# Patient Record
Sex: Male | Born: 1970 | Hispanic: Yes | Smoking: Never smoker
Health system: Southern US, Community
[De-identification: ages and names within clinical notes are randomized; demographics above are authoritative.]

## PROBLEM LIST (undated history)

## (undated) DIAGNOSIS — I4892 Unspecified atrial flutter: Secondary | ICD-10-CM

---

## 2014-07-11 ENCOUNTER — Encounter (HOSPITAL_COMMUNITY): Payer: Self-pay | Admitting: Emergency Medicine

## 2014-07-11 DIAGNOSIS — IMO0002 Reserved for concepts with insufficient information to code with codable children: Secondary | ICD-10-CM | POA: Diagnosis not present

## 2014-07-11 DIAGNOSIS — F1010 Alcohol abuse, uncomplicated: Secondary | ICD-10-CM | POA: Insufficient documentation

## 2014-07-11 DIAGNOSIS — K861 Other chronic pancreatitis: Secondary | ICD-10-CM | POA: Insufficient documentation

## 2014-07-11 DIAGNOSIS — X503XXA Overexertion from repetitive movements, initial encounter: Secondary | ICD-10-CM | POA: Insufficient documentation

## 2014-07-11 DIAGNOSIS — R1012 Left upper quadrant pain: Secondary | ICD-10-CM | POA: Insufficient documentation

## 2014-07-11 DIAGNOSIS — X500XXA Overexertion from strenuous movement or load, initial encounter: Secondary | ICD-10-CM | POA: Diagnosis not present

## 2014-07-11 DIAGNOSIS — Y9289 Other specified places as the place of occurrence of the external cause: Secondary | ICD-10-CM | POA: Diagnosis not present

## 2014-07-11 DIAGNOSIS — Y9389 Activity, other specified: Secondary | ICD-10-CM | POA: Insufficient documentation

## 2014-07-11 NOTE — ED Notes (Addendum)
Presents with 3 months of left sided abdominal pain described as burning inside pain is also in left flank and back, endorses pain with urination and pain in right flank as well. Denies fevers. Denies hematuria. Denies nausea and vomiting.

## 2014-07-11 NOTE — ED Notes (Signed)
Pt could not void at this time.  Pt has a specimen cup with instructions,

## 2014-07-12 ENCOUNTER — Emergency Department (HOSPITAL_COMMUNITY)
Admission: EM | Admit: 2014-07-12 | Discharge: 2014-07-12 | Disposition: A | Discharge status: Home / Self Care | Payer: 59 | Attending: Emergency Medicine | Admitting: Emergency Medicine

## 2014-07-12 DIAGNOSIS — K860 Alcohol-induced chronic pancreatitis: Secondary | ICD-10-CM

## 2014-07-12 DIAGNOSIS — T148XXA Other injury of unspecified body region, initial encounter: Secondary | ICD-10-CM

## 2014-07-12 LAB — CBC WITH DIFFERENTIAL/PLATELET
Basophils Absolute: 0 10*3/uL (ref 0.0–0.1)
Basophils Relative: 0 % (ref 0–1)
EOS PCT: 3 % (ref 0–5)
Eosinophils Absolute: 0.2 10*3/uL (ref 0.0–0.7)
HCT: 41.9 % (ref 39.0–52.0)
HEMOGLOBIN: 14.6 g/dL (ref 13.0–17.0)
LYMPHS ABS: 3 10*3/uL (ref 0.7–4.0)
LYMPHS PCT: 40 % (ref 12–46)
MCH: 30.8 pg (ref 26.0–34.0)
MCHC: 34.8 g/dL (ref 30.0–36.0)
MCV: 88.4 fL (ref 78.0–100.0)
MONO ABS: 0.6 10*3/uL (ref 0.1–1.0)
Monocytes Relative: 9 % (ref 3–12)
NEUTROS PCT: 48 % (ref 43–77)
Neutro Abs: 3.6 10*3/uL (ref 1.7–7.7)
PLATELETS: 197 10*3/uL (ref 150–400)
RBC: 4.74 MIL/uL (ref 4.22–5.81)
RDW: 12.7 % (ref 11.5–15.5)
WBC: 7.4 10*3/uL (ref 4.0–10.5)

## 2014-07-12 LAB — URINE MICROSCOPIC-ADD ON

## 2014-07-12 LAB — COMPREHENSIVE METABOLIC PANEL
ALT: 45 U/L (ref 0–53)
ANION GAP: 13 (ref 5–15)
AST: 24 U/L (ref 0–37)
Albumin: 3.6 g/dL (ref 3.5–5.2)
Alkaline Phosphatase: 78 U/L (ref 39–117)
BILIRUBIN TOTAL: 0.2 mg/dL — AB (ref 0.3–1.2)
BUN: 12 mg/dL (ref 6–23)
CHLORIDE: 103 meq/L (ref 96–112)
CO2: 22 meq/L (ref 19–32)
Calcium: 9.3 mg/dL (ref 8.4–10.5)
Creatinine, Ser: 0.87 mg/dL (ref 0.50–1.35)
GFR calc Af Amer: >90 mL/min (ref >90)
Glucose, Bld: 89 mg/dL (ref 70–99)
POTASSIUM: 3.6 meq/L — AB (ref 3.7–5.3)
SODIUM: 138 meq/L (ref 137–147)
Total Protein: 7.7 g/dL (ref 6.0–8.3)

## 2014-07-12 LAB — URINALYSIS, ROUTINE W REFLEX MICROSCOPIC
BILIRUBIN URINE: NEGATIVE
Glucose, UA: NEGATIVE mg/dL
HGB URINE DIPSTICK: NEGATIVE
Ketones, ur: NEGATIVE mg/dL
NITRITE: NEGATIVE
PH: 6.5 (ref 5.0–8.0)
Protein, ur: NEGATIVE mg/dL
SPECIFIC GRAVITY, URINE: 1.019 (ref 1.005–1.030)
Urobilinogen, UA: 1 mg/dL (ref 0.0–1.0)

## 2014-07-12 LAB — I-STAT TROPONIN, ED: Troponin i, poc: 0 ng/mL (ref 0.00–0.08)

## 2014-07-12 LAB — LIPASE, BLOOD: LIPASE: 48 U/L (ref 11–59)

## 2014-07-12 MED ORDER — IBUPROFEN 400 MG PO TABS
600.0000 mg | ORAL_TABLET | Freq: Once | ORAL | Status: AC
  Administered 2014-07-12: 600 mg via ORAL
  Filled 2014-07-12 (×2): qty 1

## 2014-07-12 NOTE — Discharge Instructions (Signed)
You can safely take Ibuprofen for your muscle aches It is very important that you do not drink alcohol

## 2014-07-12 NOTE — ED Provider Notes (Signed)
Medical screening examination/treatment/procedure(s) were performed by non-physician practitioner and as supervising physician I was immediately available for consultation/collaboration.    Daneisha Surges, MD 07/12/14 0420 

## 2014-07-12 NOTE — ED Provider Notes (Signed)
CSN: [id number]     Arrival date & time 07/11/14  2306 History   First MD Initiated Contact with Patient 07/12/14 0149     Chief Complaint  Patient presents with  . Abdominal Pain     (Consider location/radiation/quality/duration/timing/severity/associated sxs/prior Treatment) HPI Comments: Patient presents with 3-5 months of intermittent, left upper quadrant pain, and right flank pain.  He works as a Designer, fashion/clothing.  He, states, when he is working at the pain will increase he also drinks 6-12 coronal is a weekend and notices on Monday or Tuesday, that he has an increase in his left upper quadrant pain.  Denies any vomiting, diarrhea, fever, shortness of breath, chest pain. He has not seen anyone for this discomfort.  Tonight.  He became diaphoretic with the pain  Patient is a 43 y.o. male presenting with abdominal pain. The history is provided by the patient.  Abdominal Pain Pain location:  LUQ Pain quality: aching   Pain radiates to:  Back Onset quality:  Gradual Timing:  Intermittent Progression:  Worsening Chronicity:  Recurrent Context: alcohol use   Relieved by:  None tried Worsened by:  Movement Ineffective treatments:  None tried Associated symptoms: no chest pain, no cough, no fever, no hematuria, no nausea and no vomiting     History reviewed. No pertinent past medical history. History reviewed. No pertinent past surgical history. History reviewed. No pertinent family history. History  Substance Use Topics  . Smoking status: Never Smoker   . Smokeless tobacco: Not on file  . Alcohol Use: Yes    Review of Systems  Constitutional: Negative for fever.  Respiratory: Negative for cough and chest tightness.   Cardiovascular: Negative for chest pain and leg swelling.  Gastrointestinal: Positive for abdominal pain. Negative for nausea and vomiting.  Genitourinary: Negative for hematuria.  Musculoskeletal: Positive for back pain.  Skin: Negative for rash and wound.   Neurological: Negative for dizziness.  All other systems reviewed and are negative.     Allergies  Review of patient's allergies indicates no known allergies.  Home Medications   Prior to Admission medications   Not on File   BP 146/101  Pulse 59  Temp(Src) 98.6 F (37 C) (Oral)  Resp 16  SpO2 98% Physical Exam  Nursing note and vitals reviewed. Constitutional: He appears well-developed and well-nourished. No distress.  HENT:  Head: Atraumatic.  Mouth/Throat: Oropharynx is clear and moist.  Eyes: Pupils are equal, round, and reactive to light.  Neck: Normal range of motion.  Cardiovascular: Normal rate and regular rhythm.   Pulmonary/Chest: Effort normal. No respiratory distress. He has no wheezes. He exhibits no tenderness.  Abdominal: He exhibits no distension. There is tenderness in the left upper quadrant.  Musculoskeletal: Normal range of motion.  Skin: Skin is warm and dry. No rash noted.    ED Course  Procedures (including critical care time) Labs Review Labs Reviewed  COMPREHENSIVE METABOLIC PANEL - Abnormal; Notable for the following:    Potassium 3.6 (*)    Total Bilirubin 0.2 (*)    All other components within normal limits  URINALYSIS, ROUTINE W REFLEX MICROSCOPIC - Abnormal; Notable for the following:    Leukocytes, UA TRACE (*)    All other components within normal limits  CBC WITH DIFFERENTIAL  LIPASE, BLOOD  URINE MICROSCOPIC-ADD ON  I-STAT TROPOININ, ED    Imaging Review No results found.   EKG Interpretation   Date/Time:  Friday July 12 2014 02:18:09 EDT Ventricular Rate:  59 PR Interval:  66 QRS Duration: 100 QT Interval:  427 QTC Calculation: 423 R Axis:   86 Text Interpretation:  Sinus rhythm Short PR interval Probable inferior  infarct, old No old tracing to compare Confirmed by [facility]  MD, DAVID  ([id number]) on 07/12/2014 2:30:21 AM      MDM   Final diagnoses:  Alcohol-induced chronic pancreatitis  Muscle strain      Is reviewed all within normal limits per, potassium of 3.6, and a slightly elevated lipase of 48.  Urine is clear, with no signs of hematuria.  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

## 2016-08-17 ENCOUNTER — Emergency Department: Payer: Self-pay

## 2016-08-17 ENCOUNTER — Emergency Department
Admission: EM | Admit: 2016-08-17 | Discharge: 2016-08-17 | Disposition: A | Discharge status: Home / Self Care | Payer: Self-pay | Attending: Emergency Medicine | Admitting: Emergency Medicine

## 2016-08-17 ENCOUNTER — Encounter: Payer: Self-pay | Admitting: Occupational Medicine

## 2016-08-17 DIAGNOSIS — M25552 Pain in left hip: Secondary | ICD-10-CM

## 2016-08-17 DIAGNOSIS — W11XXXA Fall on and from ladder, initial encounter: Secondary | ICD-10-CM | POA: Insufficient documentation

## 2016-08-17 DIAGNOSIS — M79642 Pain in left hand: Secondary | ICD-10-CM | POA: Insufficient documentation

## 2016-08-17 DIAGNOSIS — M545 Low back pain, unspecified: Secondary | ICD-10-CM

## 2016-08-17 DIAGNOSIS — Y9389 Activity, other specified: Secondary | ICD-10-CM | POA: Insufficient documentation

## 2016-08-17 DIAGNOSIS — Y990 Civilian activity done for income or pay: Secondary | ICD-10-CM | POA: Insufficient documentation

## 2016-08-17 DIAGNOSIS — W19XXXA Unspecified fall, initial encounter: Secondary | ICD-10-CM

## 2016-08-17 DIAGNOSIS — Y929 Unspecified place or not applicable: Secondary | ICD-10-CM | POA: Insufficient documentation

## 2016-08-17 DIAGNOSIS — M25521 Pain in right elbow: Secondary | ICD-10-CM

## 2016-08-17 DIAGNOSIS — R55 Syncope and collapse: Secondary | ICD-10-CM

## 2016-08-17 DIAGNOSIS — G9389 Other specified disorders of brain: Secondary | ICD-10-CM | POA: Insufficient documentation

## 2016-08-17 DIAGNOSIS — M79643 Pain in unspecified hand: Secondary | ICD-10-CM

## 2016-08-17 LAB — CBC
HCT: 46.8 % (ref 40.0–52.0)
HEMOGLOBIN: 15.8 g/dL (ref 13.0–18.0)
MCH: 29.2 pg (ref 26.0–34.0)
MCHC: 33.8 g/dL (ref 32.0–36.0)
MCV: 86.4 fL (ref 80.0–100.0)
PLATELETS: 189 10*3/uL (ref 150–440)
RBC: 5.41 MIL/uL (ref 4.40–5.90)
RDW: 13.1 % (ref 11.5–14.5)
WBC: 11 10*3/uL — ABNORMAL HIGH (ref 3.8–10.6)

## 2016-08-17 LAB — TROPONIN I: Troponin I: <0.03 ng/mL (ref <0.03)

## 2016-08-17 LAB — COMPREHENSIVE METABOLIC PANEL
ALK PHOS: 69 U/L (ref 38–126)
ALT: 43 U/L (ref 17–63)
ANION GAP: 6 (ref 5–15)
AST: 32 U/L (ref 15–41)
Albumin: 4.7 g/dL (ref 3.5–5.0)
BUN: 18 mg/dL (ref 6–20)
CO2: 26 mmol/L (ref 22–32)
Calcium: 9.3 mg/dL (ref 8.9–10.3)
Chloride: 107 mmol/L (ref 101–111)
Creatinine, Ser: 0.99 mg/dL (ref 0.61–1.24)
Glucose, Bld: 102 mg/dL — ABNORMAL HIGH (ref 65–99)
Potassium: 3.6 mmol/L (ref 3.5–5.1)
SODIUM: 139 mmol/L (ref 135–145)
Total Bilirubin: 0.8 mg/dL (ref 0.3–1.2)
Total Protein: 8.7 g/dL — ABNORMAL HIGH (ref 6.5–8.1)

## 2016-08-17 LAB — GLUCOSE, CAPILLARY: Glucose-Capillary: 127 mg/dL — ABNORMAL HIGH (ref 65–99)

## 2016-08-17 MED ORDER — MORPHINE SULFATE (PF) 4 MG/ML IV SOLN
4.0000 mg | Freq: Once | INTRAVENOUS | Status: AC
  Administered 2016-08-17: 4 mg via INTRAVENOUS
  Filled 2016-08-17: qty 1

## 2016-08-17 MED ORDER — OXYCODONE-ACETAMINOPHEN 5-325 MG PO TABS: 1.0000 | ORAL_TABLET | ORAL | 0 refills | Status: AC | PRN

## 2016-08-17 MED ORDER — OXYCODONE-ACETAMINOPHEN 5-325 MG PO TABS
2.0000 | ORAL_TABLET | Freq: Once | ORAL | Status: AC
  Administered 2016-08-17: 2 via ORAL
  Filled 2016-08-17: qty 2

## 2016-08-17 MED ORDER — HYDROMORPHONE HCL 1 MG/ML IJ SOLN
0.5000 mg | Freq: Once | INTRAMUSCULAR | Status: AC
Start: 2016-08-17 — End: 2016-08-17
  Administered 2016-08-17: 0.5 mg via INTRAVENOUS
  Filled 2016-08-17: qty 1

## 2016-08-17 MED ORDER — ONDANSETRON HCL 4 MG/2ML IJ SOLN
4.0000 mg | Freq: Once | INTRAMUSCULAR | Status: AC
  Administered 2016-08-17: 4 mg via INTRAVENOUS
  Filled 2016-08-17: qty 2

## 2016-08-17 MED ORDER — IBUPROFEN 800 MG PO TABS: 800.0000 mg | ORAL_TABLET | Freq: Three times a day (TID) | ORAL | 0 refills | Status: DC | PRN

## 2016-08-17 NOTE — ED Notes (Signed)
Pt states pain 7/10. Doesn't want anymore pain meds til after results.

## 2016-08-17 NOTE — ED Provider Notes (Signed)
This patient was signed out to me by Dr. Darnelle CatalanMalinda.  45 y.o. male after fall. The patient's workup in the emergency department has not showed any acute fractures or injuries from his fall. He did have a vasovagal episode after receiving Dilaudid, not having eaten anything since lunch, and having pain from his back contusion. At this time, his symptoms have completely resolved and he is feeling much better. He does have some mild ongoing back pain. I'll plan to discharge the patient home with expectant management for his injuries. [facility] He and his family understand return precautions as well as follow-up instructions.   Rockne MenghiniAnne-Caroline Nichole Keltner, MD 08/17/16 2311

## 2016-08-17 NOTE — ED Notes (Signed)
Pt feeling better pain 4/10 to left hip and left lower back. Pt drinking water. Requesting something to eat. O2 taking off. Will continue to monitor.

## 2016-08-17 NOTE — ED Notes (Signed)
Patient transported to CT and xray 

## 2016-08-17 NOTE — Discharge Instructions (Signed)
You may apply ice or heat for 15 minutes every 2 hours while you were awake for pain.  You may take Tylenol and Motrin for mild to moderate pain.  Percocet is for severe pain.  Do not drive within 8 hours of taking Percocet.  Return to the emergency department if you develop severe pain, numbness tingling or weakness, changes in bladder or bowel function, severe headache or vomiting, or any other symptoms concerning to you.

## 2016-08-17 NOTE — ED Provider Notes (Signed)
[facility] Emergency Department Provider Note     First MD Initiated Contact with Patient 08/17/16 1843     (approximate)  I have reviewed the triage vital signs and the nursing notes.   HISTORY  Chief Complaint Fall (from roof denies LOC and hitting head ); Hip Pain (left); Arm Pain (right); and Hand Pain (left)    HPI Kenneth Marsh is a 45 y.o. male patient was at work reports he fell off ladder about 12-15 feet. He complains of pain in the left hip left thenar eminence and the right elbow. He denies any loss of consciousness. Says he didn't think he hit his head. Hip is the worst pain. He denies any numbness or tingling but the elbow pain does radiate down into the forearm. Pain is moderately severe and achy in nature.Marland Kitchen   History reviewed. No pertinent past medical history.  There are no active problems to display for this patient.   History reviewed. No pertinent surgical history.  Prior to Admission medications   Not on File    Allergies Review of patient's allergies indicates no known allergies.  History reviewed. No pertinent family history.  Social History Social History  Substance Use Topics  . Smoking status: Never Smoker  . Smokeless tobacco: Never Used  . Alcohol use Yes    Review of Systems Constitutional: No fever/chills Eyes: No visual changes. ENT: No sore throat. Cardiovascular: Denies chest pain. Respiratory: Denies shortness of breath. Gastrointestinal: No abdominal pain.  No nausea, no vomiting.  No diarrhea.  No constipation. Genitourinary: Negative for dysuria. Musculoskeletal: Negative for back pain. Skin: Negative for rash. Neurological: Negative for headaches, focal weakness or numbness.  10-point ROS otherwise negative.  ____________________________________________   PHYSICAL EXAM:  VITAL SIGNS: ED Triage Vitals [08/17/16 1828]  Enc Vitals Group     BP (!) 153/98     Pulse Rate 86     Resp  18     Temp 98 F (36.7 C)     Temp Source Oral     SpO2 97 %     Weight 219 lb 8 oz (99.6 kg)     Height 5\' 8"  (1.727 m)     Head Circumference      Peak Flow      Pain Score 9     Pain Loc      Pain Edu?      Excl. in GC?     Constitutional: Alert and oriented. Well appearing Eyes: Conjunctivae are normal. PERRL. EOMI. Head: Atraumatic. Nose: No congestion/rhinnorhea. Mouth/Throat: Mucous membranes are moist.  Oropharynx non-erythematous. Neck: No stridor.No cervical spine tenderness to palpation Cardiovascular: Normal rate, regular rhythm. Grossly normal heart sounds.  Good peripheral circulation. Respiratory: Normal respiratory effort.  No retractions. Lungs CTAB. Gastrointestinal: Soft and nontender. No distention. No abdominal bruits. No CVA tenderness.Low back no tenderness Musculoskeletal: Tenderness in the hip as described in history of present illness tenderness in the elbow and the hand as described in history of present illness. Is no tenderness in the hand in any of the phalanges. Only new thenar eminence even to palpation full range of motion all joints in the hand Neurologic:  Normal speech and language. No gross focal neurologic deficits are appreciated. No gait instability. Skin:  Skin is warm, dry and intact. No rash noted. Psychiatric: Mood and affect are normal. Speech and behavior are normal.  ____________________________________________   LABS (all labs ordered are listed, but only abnormal results are displayed)  Labs Reviewed  COMPREHENSIVE METABOLIC PANEL - Abnormal; Notable for the following:       Result Value   Glucose, Bld 102 (*)    Total Protein 8.7 (*)    All other components within normal limits  CBC - Abnormal; Notable for the following:    WBC 11.0 (*)    All other components within normal limits  TROPONIN I  CBG MONITORING, ED    ____________________________________________  EKG   ____________________________________________  RADIOLOGY Study Result   CLINICAL DATA:  45 year old male with fall and left hand pain.  EXAM: LEFT HAND - COMPLETE 3+ VIEW  COMPARISON:  None.  FINDINGS: There is a tiny triangular corner bone fragment the radial base of the proximal phalanx of the third digit concerning for acute fracture. Correlation with clinical exam and point tenderness recommended. No other fracture identified. No dislocation. There is no arthritic changes. The soft tissues appear unremarkable.  IMPRESSION: Small bony fragment adjacent to the radial base of the proximal phalanx of the third digit concerning for an acute fracture. Clinical correlation is recommended.   Electronically Signed   By: Elgie Collard M.D.   On: 08/17/2016 19:16  There is no tenderness in this area of the patient's hand. The only tenderness is in the thenar eminence. There is no snuffbox tenderness either.    Study Result   CLINICAL DATA:  Pain following fall from roof  EXAM: CT HEAD WITHOUT CONTRAST  CT CERVICAL SPINE WITHOUT CONTRAST  TECHNIQUE: Multidetector CT imaging of the head and cervical spine was performed following the standard protocol without intravenous contrast. Multiplanar CT image reconstructions of the cervical spine were also generated.  COMPARISON:  None.  FINDINGS: CT HEAD FINDINGS  Brain: The ventricles are normal in size and configuration. There is no intracranial mass, hemorrhage, extra-axial fluid collection, or midline shift. Gray-white compartments are normal. No acute infarct evident.  Vascular: Hyperdense vessel.  No vascular calcifications.  Skull: Bony calvarium appears intact. There is hematoma in the muscles over the right temporal bone.  Sinuses/Orbits: There is opacification of several ethmoid air cells bilaterally. Other visualized paranasal sinuses  are clear. Orbits appear symmetric bilaterally.  Other: Mastoid air cells are clear.  CT CERVICAL SPINE FINDINGS  Alignment: There is no spondylolisthesis.  Skull base and vertebrae: Craniocervical junction region and skull base regions are normal. No fracture evident. No blastic or lytic bone lesions.  Soft tissues and spinal canal: Prevertebral soft tissues and predental space regions are normal. No paraspinous lesions. No spinal stenosis.  Disc levels: There is slight disc space narrowing at C5-6 and C6-7. There is mild facet hypertrophy bilaterally. No nerve root edema or effacement. No disc extrusion evident.  Upper chest: Visualized lung apices are clear.  Other: None  IMPRESSION: CT head: There is hematoma in the muscles overlying the right temporal bone. No fracture evident. No intracranial mass, hemorrhage, or extra-axial fluid collection. Gray-white compartments appear normal.  CT cervical spine: No fracture or spondylolisthesis. Mild osteoarthritic change at several levels.   Electronically Signed   By: Bretta Bang III M.D.   On: 08/17/2016 19:20    Study Result   CLINICAL DATA:  Fall from 10/2014 fever.  EXAM: RIGHT ELBOW - COMPLETE 3+ VIEW; RIGHT FOREARM - 2 VIEW  COMPARISON:  None.  FINDINGS: There is no evidence of fracture, dislocation, or joint effusion. There is no evidence of arthropathy or other focal bone abnormality. Soft tissues are unremarkable.  IMPRESSION: No fracture or dislocation of  the right elbow or forearm.   Electronically Signed   By: Deatra RobinsonKevin  Herman M.D.   On: 08/17/2016 19:19   Study Result   CLINICAL DATA:  Fall from 10/2014 fever.  EXAM: RIGHT ELBOW - COMPLETE 3+ VIEW; RIGHT FOREARM - 2 VIEW  COMPARISON:  None.  FINDINGS: There is no evidence of fracture, dislocation, or joint effusion. There is no evidence of arthropathy or other focal bone abnormality. Soft tissues are  unremarkable.  IMPRESSION: No fracture or dislocation of the right elbow or forearm.   Electronically Signed   By: Deatra RobinsonKevin  Herman M.D.   On: 08/17/2016 19:19   Study Result   CLINICAL DATA:  Pain following fall  EXAM: DG HIP (WITH OR WITHOUT PELVIS) 2-3V LEFT  COMPARISON:  None.  FINDINGS: Frontal pelvis as well as frontal and lateral left hip images were obtained. There is no evident fracture or dislocation. The joint spaces appear normal. No erosive change.  IMPRESSION: No fracture or dislocation.  No evident arthropathy.   Electronically Signed   By: Bretta BangWilliam  Woodruff III M.D.   On: 08/17/2016 19:21      ____________________________________________   PROCEDURES  Procedure(s) performed: 1 CT of the hip came back negative we tried to see if patient could sit up and walk patient had had no back pain up until this time but one sitting he complains of pain in his low back. And has difficulty sitting. We'll give him some Dilaudid again and x-ray his back and then if he still cannot bear weight we will do an MRI of his hip   Procedures  Critical Care performed:  ____________________________________________   INITIAL IMPRESSION / ASSESSMENT AND PLAN / ED COURSE  Pertinent labs & imaging results that were available during my care of the patient were reviewed by me and considered in my medical decision making (see chart for details).    Clinical Course   Patient is able to ambulate to the bathroom. Patient goes to x-ray after complaining of low back pain and then on coming back has a near syncopal episode where he gets pale sweaty and has to lay down. This is also after he got Dilaudid. I'm still waiting for the results of the L-spine x-rays. I'll sign the patient out to Dr. Sharma CovertNorman.  ____________________________________________   FINAL CLINICAL IMPRESSION(S) / ED DIAGNOSES  Final diagnoses:  Fall, initial encounter      NEW MEDICATIONS STARTED  DURING THIS VISIT:  New Prescriptions   No medications on file     Note:  This document was prepared using Dragon voice recognition software and may include unintentional dictation errors.    Arnaldo NatalPaul F Malinda, MD 08/17/16 2211

## 2016-08-17 NOTE — ED Triage Notes (Signed)
Pt presents via ems from job site.  Not sure the name of company.  Pt is alert and orient x4. Breathing even and unlabored. Pt states feel off roof 12-15 ft. No LOC, denies hitting head. Pt c/o to left hip with movement no deformity noted. Pt co pain to left hand and right elbow down the forearm. Pt has c-collar in place by EMS. Pt denies any neck pain or back pain. Pt to areas 9/10.  Pt has good pulses to extremities.

## 2016-08-17 NOTE — ED Notes (Signed)
Pt back from Xray 2155 thinks he is going t o pass out clammy hot. Color poor. Pt assisted to bed. Dr Darnelle CatalanMalinda at the bedside. Pt vs checks. O2 88 RA given o2 at 2L at this time. O2 up to 95-97%on the 2L via [place]. Pt color, clamminess improving. Will continue to montior.

## 2016-08-17 NOTE — ED Notes (Signed)
Patient transported to CT 

## 2016-08-17 NOTE — ED Notes (Signed)
Pt states feeling some better now. Will continue to monitor.

## 2016-08-17 NOTE — ED Notes (Signed)
Pt after getting up co of back pain. MD ordering back xrays and more pain meds.

## 2016-08-17 NOTE — ED Notes (Signed)
Pt made aware Dr Sharma CovertNorman wanting to wait for back xray before eating. Will continue to monitor. 96%RA

## 2017-01-11 ENCOUNTER — Emergency Department (HOSPITAL_COMMUNITY): Payer: Self-pay

## 2017-01-11 ENCOUNTER — Emergency Department (HOSPITAL_COMMUNITY)
Admission: EM | Admit: 2017-01-11 | Discharge: 2017-01-12 | Disposition: A | Discharge status: Home / Self Care | Payer: Self-pay | Attending: Emergency Medicine | Admitting: Emergency Medicine

## 2017-01-11 ENCOUNTER — Encounter (HOSPITAL_COMMUNITY): Payer: Self-pay | Admitting: Emergency Medicine

## 2017-01-11 DIAGNOSIS — Y990 Civilian activity done for income or pay: Secondary | ICD-10-CM | POA: Insufficient documentation

## 2017-01-11 DIAGNOSIS — Y9289 Other specified places as the place of occurrence of the external cause: Secondary | ICD-10-CM | POA: Insufficient documentation

## 2017-01-11 DIAGNOSIS — S61422A Laceration with foreign body of left hand, initial encounter: Secondary | ICD-10-CM | POA: Insufficient documentation

## 2017-01-11 DIAGNOSIS — Y9389 Activity, other specified: Secondary | ICD-10-CM | POA: Insufficient documentation

## 2017-01-11 DIAGNOSIS — W132XXA Fall from, out of or through roof, initial encounter: Secondary | ICD-10-CM | POA: Insufficient documentation

## 2017-01-11 DIAGNOSIS — Z23 Encounter for immunization: Secondary | ICD-10-CM | POA: Insufficient documentation

## 2017-01-11 MED ORDER — LIDOCAINE HCL (PF) 1 % IJ SOLN
INTRAMUSCULAR | Status: AC
  Filled 2017-01-11: qty 5

## 2017-01-11 MED ORDER — TETANUS-DIPHTH-ACELL PERTUSSIS 5-2.5-18.5 LF-MCG/0.5 IM SUSP
0.5000 mL | Freq: Once | INTRAMUSCULAR | Status: AC
  Administered 2017-01-11: 0.5 mL via INTRAMUSCULAR
  Filled 2017-01-11: qty 0.5

## 2017-01-11 MED ORDER — LIDOCAINE HCL (PF) 1 % IJ SOLN
5.0000 mL | Freq: Once | INTRAMUSCULAR | Status: AC
  Administered 2017-01-11: 5 mL
  Filled 2017-01-11: qty 5

## 2017-01-11 NOTE — ED Notes (Signed)
On way to XR 

## 2017-01-11 NOTE — ED Provider Notes (Signed)
[facility]-EMERGENCY DEPT Provider Note   CSN: [id number] Arrival date & time: 01/11/17  2145   By signing my name below, I, Clovis Pu, attest that this documentation has been prepared under the direction and in the presence of  Kerrie Buffalo, NP. Electronically Signed: Clovis Pu, ED Scribe. 01/11/17. 10:42 PM.   History   Chief Complaint Chief Complaint  Patient presents with  . Extremity Laceration   The history is provided by the patient. No language interpreter was used.  Laceration   The incident occurred 6 to 12 hours ago. The laceration is located on the left hand. The laceration is 4 cm in size. The laceration mechanism was a a nail. The pain is moderate. The pain has been constant since onset. He reports no foreign bodies present. His tetanus status is unknown.   HPI Comments:  Kenneth Marsh is a 46 y.o. male who presents to the Emergency Department complaining of sudden onset laceration to his left hand with associated pai which occurred 7 hour ago today. 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 No alleviating factors noted. Pt denies any other associated symptoms. Tetanus status unknown.    History reviewed. No pertinent past medical history.  There are no active problems to display for this patient.   History reviewed. No pertinent surgical history.   Home Medications    Prior to Admission medications   Medication Sig Start Date End Date Taking? Authorizing Provider  cephALEXin (KEFLEX) 500 MG capsule Take 1 capsule (500 mg total) by mouth 4 (four) times daily. 01/12/17   Hope Orlene Och, NP  ibuprofen (ADVIL,MOTRIN) 800 MG tablet Take 1 tablet (800 mg total) by mouth every 8 (eight) hours as needed (with food). 08/17/16   Rockne Menghini, MD  oxyCODONE-acetaminophen (ROXICET) 5-325 MG tablet Take 1-2 tablets by mouth every 4 (four) hours as needed for severe pain. 08/17/16 08/17/17  Rockne Menghini,  MD    Family History History reviewed. No pertinent family history.  Social History Social History  Substance Use Topics  . Smoking status: Never Smoker  . Smokeless tobacco: Never Used  . Alcohol use Yes     Allergies   Patient has no known allergies.   Review of Systems Review of Systems  Musculoskeletal: Positive for myalgias.  Skin: Positive for wound.  Neurological: Negative for numbness.   Physical Exam Updated Vital Signs BP (!) 154/106 (BP Location: Right Arm)   Pulse 93   Temp 98.1 F (36.7 C) (Oral)   Resp 18   Ht 5\' 8"  (1.727 m)   Wt 99.8 kg   SpO2 96%   BMI 33.45 kg/m   Physical Exam  Constitutional: He is oriented to person, place, and time. He appears well-developed and well-nourished. No distress.  HENT:  Head: Normocephalic and atraumatic.  Eyes: Conjunctivae are normal.  Neck: Neck supple.  Cardiovascular: Normal rate.   Pulmonary/Chest: Effort normal.  Musculoskeletal:       Left hand: He exhibits tenderness and laceration. He exhibits normal range of motion and normal capillary refill. Normal sensation noted. Normal strength noted.       Hands: Neurological: He is alert and oriented to person, place, and time.  Skin: Skin is warm and dry.  4 cm V-shaped flap laceration to the thenar area of the left hand   Psychiatric: He has a normal mood and affect.  Nursing note and vitals reviewed.  ED Treatments / Results  DIAGNOSTIC STUDIES:  Oxygen Saturation is 96% on RA, normal by my interpretation.    COORDINATION OF CARE:  10:41 PM Discussed treatment plan with pt at bedside and pt agreed to plan.   Radiology Dg Hand 2 View Left  Result Date: 01/11/2017 CLINICAL DATA:  Fall from roof with left hand laceration EXAM: LEFT HAND - 2 VIEW COMPARISON:  Left hand radiograph 08/17/2016 FINDINGS: There is marked soft tissue swelling of the left hand. Small osseous fragment adjacent to the base of the third proximal phalanx is unchanged. No  acute fracture or dislocation. Joint spaces are normal. IMPRESSION: No acute osseous injury of the left hand. Electronically Signed   By: Deatra RobinsonKevin  Herman M.D.   On: 01/11/2017 22:41    Procedures .Marland Kitchen.Laceration Repair Date/Time: 01/12/2017 12:01 AM Performed by: Janne NapoleonNEESE, HOPE M Authorized by: Janne NapoleonNEESE, HOPE M   Consent:    Consent obtained:  Verbal   Consent given by:  Patient   Risks discussed:  Infection and poor wound healing   Alternatives discussed:  No treatment Anesthesia (see MAR for exact dosages):    Anesthesia method:  Local infiltration   Local anesthetic:  Lidocaine 1% w/o epi Laceration details:    Location:  Hand   Hand location:  L palm   Length (cm):  4 Repair type:    Repair type:  Simple Pre-procedure details:    Preparation:  Patient was prepped and draped in usual sterile fashion and imaging obtained to evaluate for foreign bodies Exploration:    Hemostasis achieved with:  Direct pressure   Wound exploration: wound explored through full range of motion and entire depth of wound probed and visualized     Contaminated: yes   Treatment:    Area cleansed with:  Betadine   Amount of cleaning:  Extensive   Irrigation solution:  Sterile saline   Irrigation volume:  500 cc    Irrigation method:  Syringe   Visualized foreign bodies/material removed: yes   Skin repair:    Repair method:  Sutures   Suture size:  4-0   Suture material:  Prolene   Suture technique:  Simple interrupted   Number of sutures:  5 Approximation:    Approximation:  Close Post-procedure details:    Dressing:  Non-adherent dressing and sterile dressing   Patient tolerance of procedure:  Tolerated well, no immediate complications     (including critical care time)  Medications Ordered in ED Medications  cephALEXin (KEFLEX) capsule 500 mg (not administered)  ibuprofen (ADVIL,MOTRIN) tablet 600 mg (not administered)  lidocaine (PF) (XYLOCAINE) 1 % injection 5 mL (5 mLs Infiltration Given  01/11/17 2249)  Tdap (BOOSTRIX) injection 0.5 mL (0.5 mLs Intramuscular Given 01/11/17 2250)     Initial Impression / Assessment and Plan / ED Course  I have reviewed the triage vital signs and the nursing notes.  Pertinent imaging results that were available during my care of the patient were reviewed by me and considered in my medical decision making (see chart for details).     Tetanus updated in ED. Laceration occurred < 12 hours prior to repair. Discussed laceration care with pt and answered questions. Pt to f-u for suture removal in 7 to 10 days and wound check sooner should there be signs of dehiscence or infection. Will start antibiotics due to the contamination of the wound.  Pt is hemodynamically stable with no complaints prior to dc.    Final Clinical Impressions(s) / ED Diagnoses  Final diagnoses:  Laceration of left hand with foreign body, initial encounter    New Prescriptions New Prescriptions   CEPHALEXIN (KEFLEX) 500 MG CAPSULE    Take 1 capsule (500 mg total) by mouth 4 (four) times daily.  I personally performed the services described in this documentation, which was scribed in my presence. The recorded information has been reviewed and is accurate.     [address] [place], [place] 01/12/17 1610    Gwyneth Sprout, MD 01/14/17 [phone]

## 2017-01-11 NOTE — ED Triage Notes (Signed)
Pt states he fell from a roof while working and got a laceration on his left hand. C/o 7/10 pain.

## 2017-01-12 MED ORDER — CEPHALEXIN 500 MG PO CAPS: 500.0000 mg | ORAL_CAPSULE | Freq: Four times a day (QID) | ORAL | 0 refills | Status: DC

## 2017-01-12 MED ORDER — IBUPROFEN 400 MG PO TABS
600.0000 mg | ORAL_TABLET | Freq: Once | ORAL | Status: AC
  Administered 2017-01-12: 600 mg via ORAL
  Filled 2017-01-12: qty 1

## 2017-01-12 MED ORDER — CEPHALEXIN 250 MG PO CAPS
500.0000 mg | ORAL_CAPSULE | Freq: Once | ORAL | Status: AC
  Administered 2017-01-12: 500 mg via ORAL
  Filled 2017-01-12: qty 2

## 2017-01-12 NOTE — ED Notes (Signed)
Hope NP at bedside suturing

## 2017-01-12 NOTE — Discharge Instructions (Signed)
Follow up for suture removal in 7 to 10 days, return sooner for any signs of infection.

## 2017-10-29 IMAGING — CT CT HEAD W/O CM
4 of 7 series · 15 of 47 positions shown, 16 images · non-contrast
Comparison: None.

CLINICAL DATA: Pain following fall from roof

EXAM:
CT HEAD WITHOUT CONTRAST
CT CERVICAL SPINE WITHOUT CONTRAST
TECHNIQUE: Multidetector CT imaging of the head and cervical spine was
performed following the standard protocol without intravenous
contrast. Multiplanar CT image reconstructions of the cervical spine
were also generated.

[Series 2: head wo · axial · 0.42mm/px · z∈[+221,+271]mm · 2 of 31 slices shown, 3 images]
[im 11/31  brain]
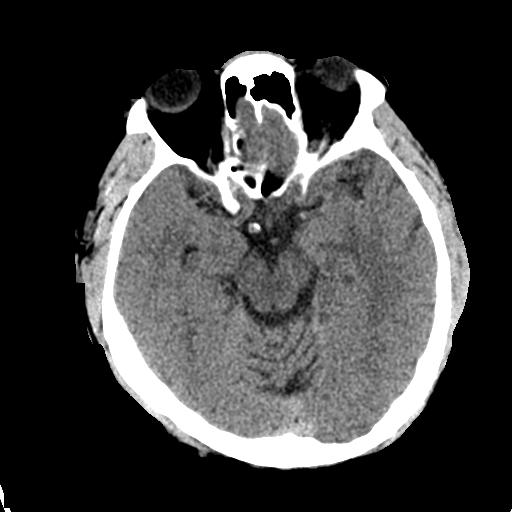
[im 11/31  bone]
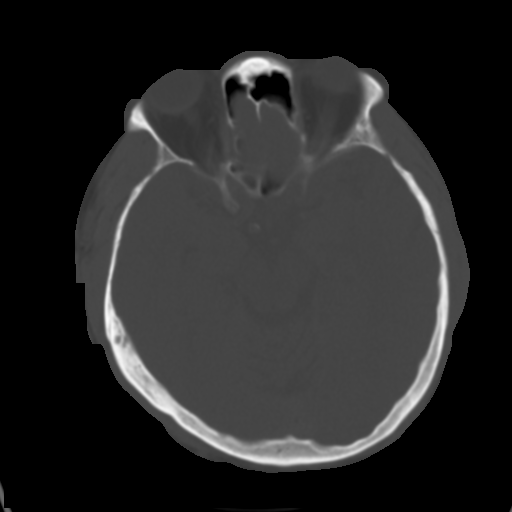
[im 21/31  brain]
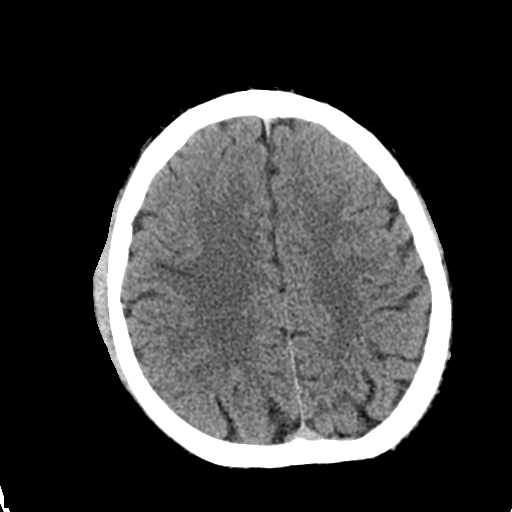

[Series 4: coronal soft tissue · coronal · 0.30mm/px · 3 of 62 slices shown]
[im 13/62  brain]
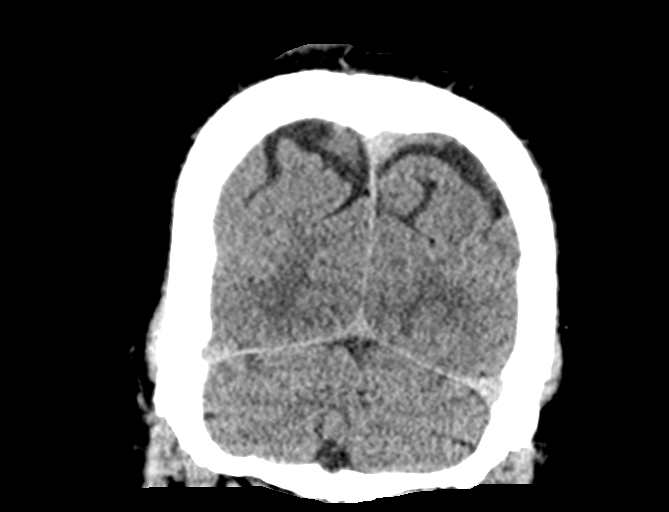
[im 17/62  brain]
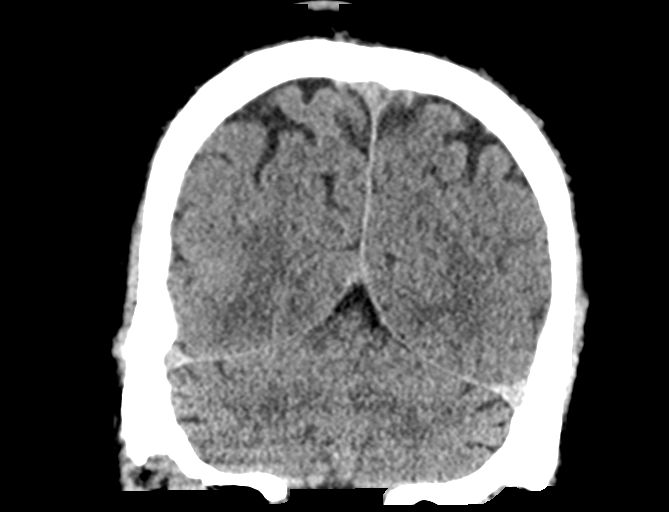
[im 21/62  brain]
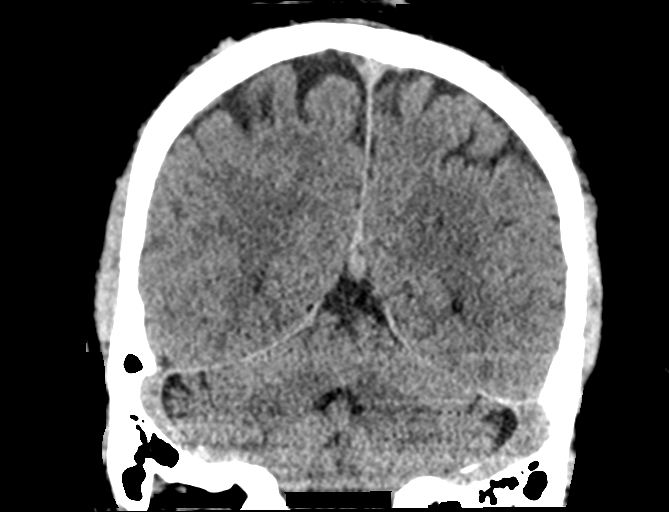

[Series 5: sagittal soft tissue · sagittal · 0.30mm/px · 2 of 59 slices shown]
[im 20/59  brain]
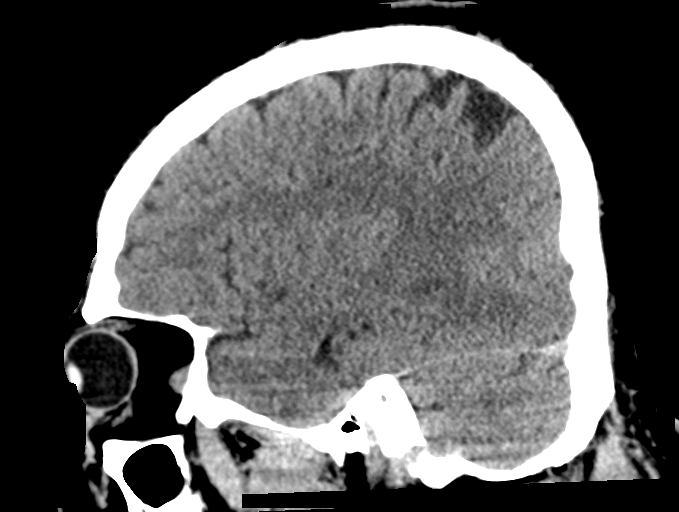
[im 39/59  brain]
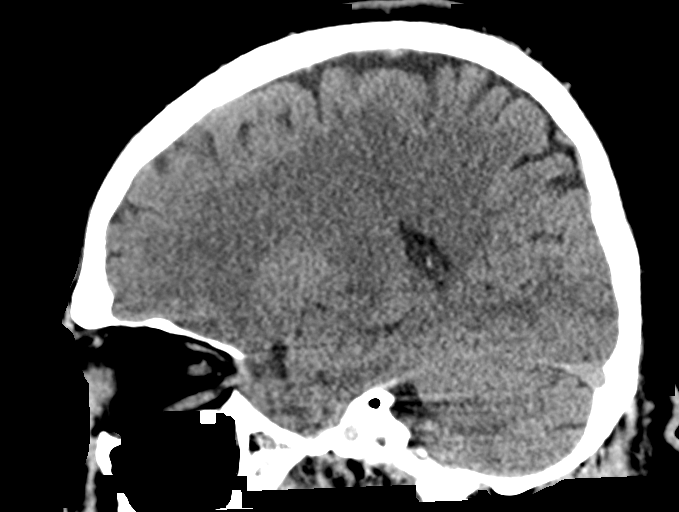

[Series 10: orthogonal bone · axial · 0.23mm/px · z∈[-29,+143]mm · 8 of 107 slices shown]
[im 9/107  bone]
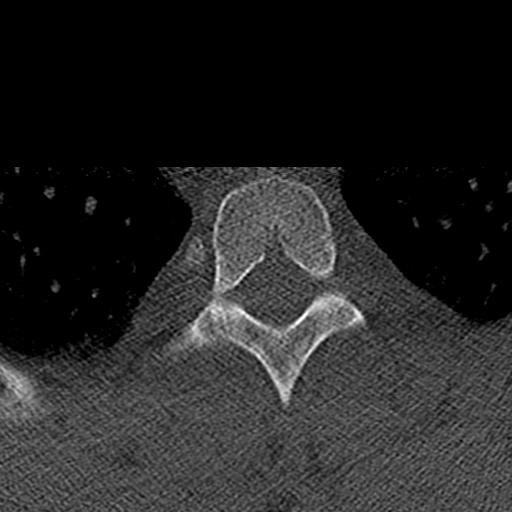
[im 27/107  bone]
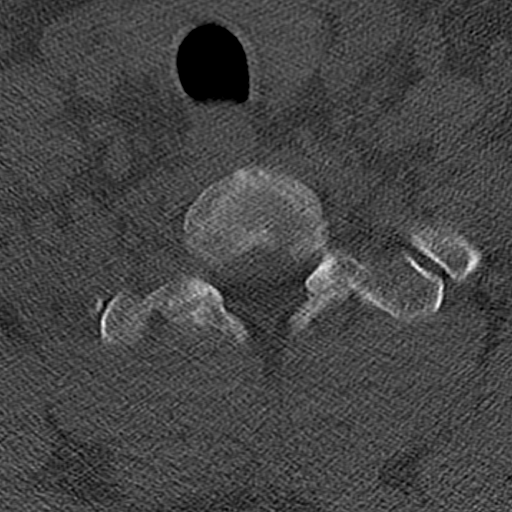
[im 36/107  bone]
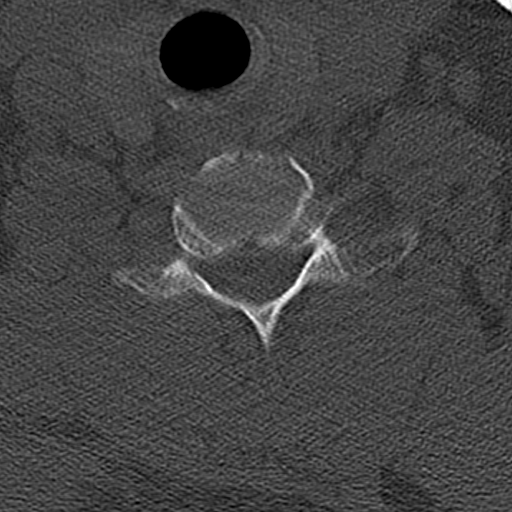
[im 45/107  bone]
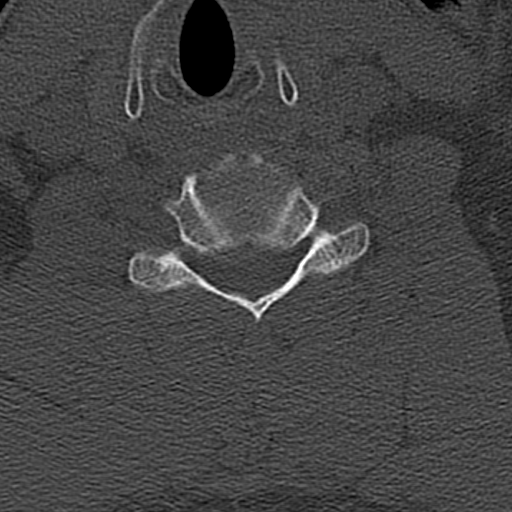
[im 62/107  bone]
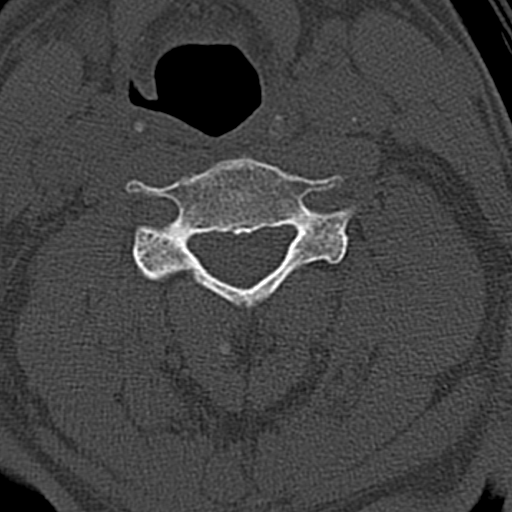
[im 71/107  bone]
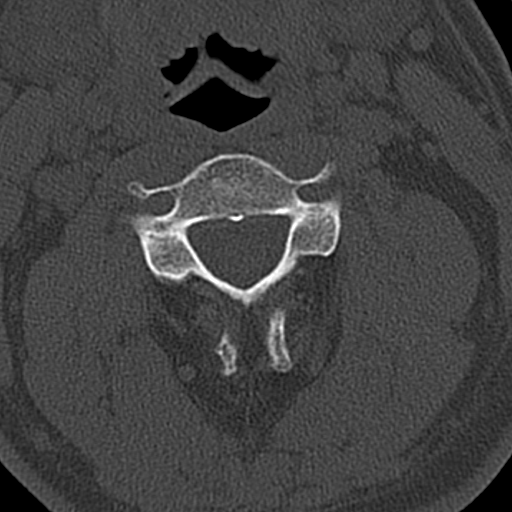
[im 80/107  bone]
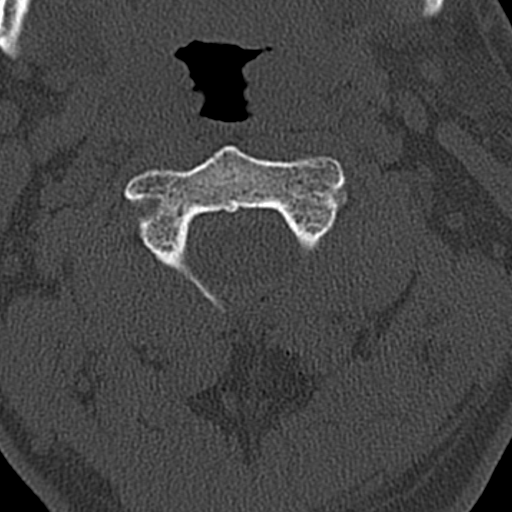
[im 98/107  bone]
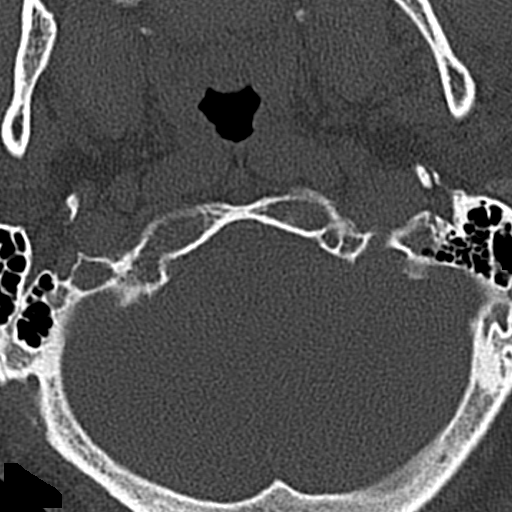

[15 of 47 positions shown; findings below may reference images not displayed]

FINDINGS: CT HEAD FINDINGS

Brain: The ventricles are normal in size and configuration. There is
no intracranial mass, hemorrhage, extra-axial fluid collection, or
midline shift. Gray-white compartments are normal. No acute infarct
evident.

Vascular: Hyperdense vessel.  No vascular calcifications.

Skull: Bony calvarium appears intact. There is hematoma in the
muscles over the right temporal bone.

Sinuses/Orbits: There is opacification of several ethmoid air cells
bilaterally. Other visualized paranasal sinuses are clear. Orbits
appear symmetric bilaterally.

Other: Mastoid air cells are clear.

CT CERVICAL SPINE FINDINGS

Alignment: There is no spondylolisthesis.

Skull base and vertebrae: Craniocervical junction region and skull
base regions are normal. No fracture evident. No blastic or lytic
bone lesions.

Soft tissues and spinal canal: Prevertebral soft tissues and
predental space regions are normal. No paraspinous lesions. No
spinal stenosis.

Disc levels: There is slight disc space narrowing at C5-6 and C6-7.
There is mild facet hypertrophy bilaterally. No nerve root edema or
effacement. No disc extrusion evident.

Upper chest: Visualized lung apices are clear.

Other: None
IMPRESSION: CT head: There is hematoma in the muscles overlying the right
temporal bone. No fracture evident. No intracranial mass,
hemorrhage, or extra-axial fluid collection. Gray-white compartments
appear normal.

CT cervical spine: No fracture or spondylolisthesis. Mild
osteoarthritic change at several levels.

## 2017-10-29 IMAGING — CR DG HIP (WITH OR WITHOUT PELVIS) 2-3V*L*
1 series · 3 of 3 positions shown · non-contrast
Comparison: None.

CLINICAL DATA: Pain following fall

EXAM:
DG HIP (WITH OR WITHOUT PELVIS) 2-3V LEFT

[Series 1: dg hip unilat w or w/o pelvis 2-3 views  · non-contrast · 0.14mm/px · 3 of 3 slices shown]
[im 1/3]
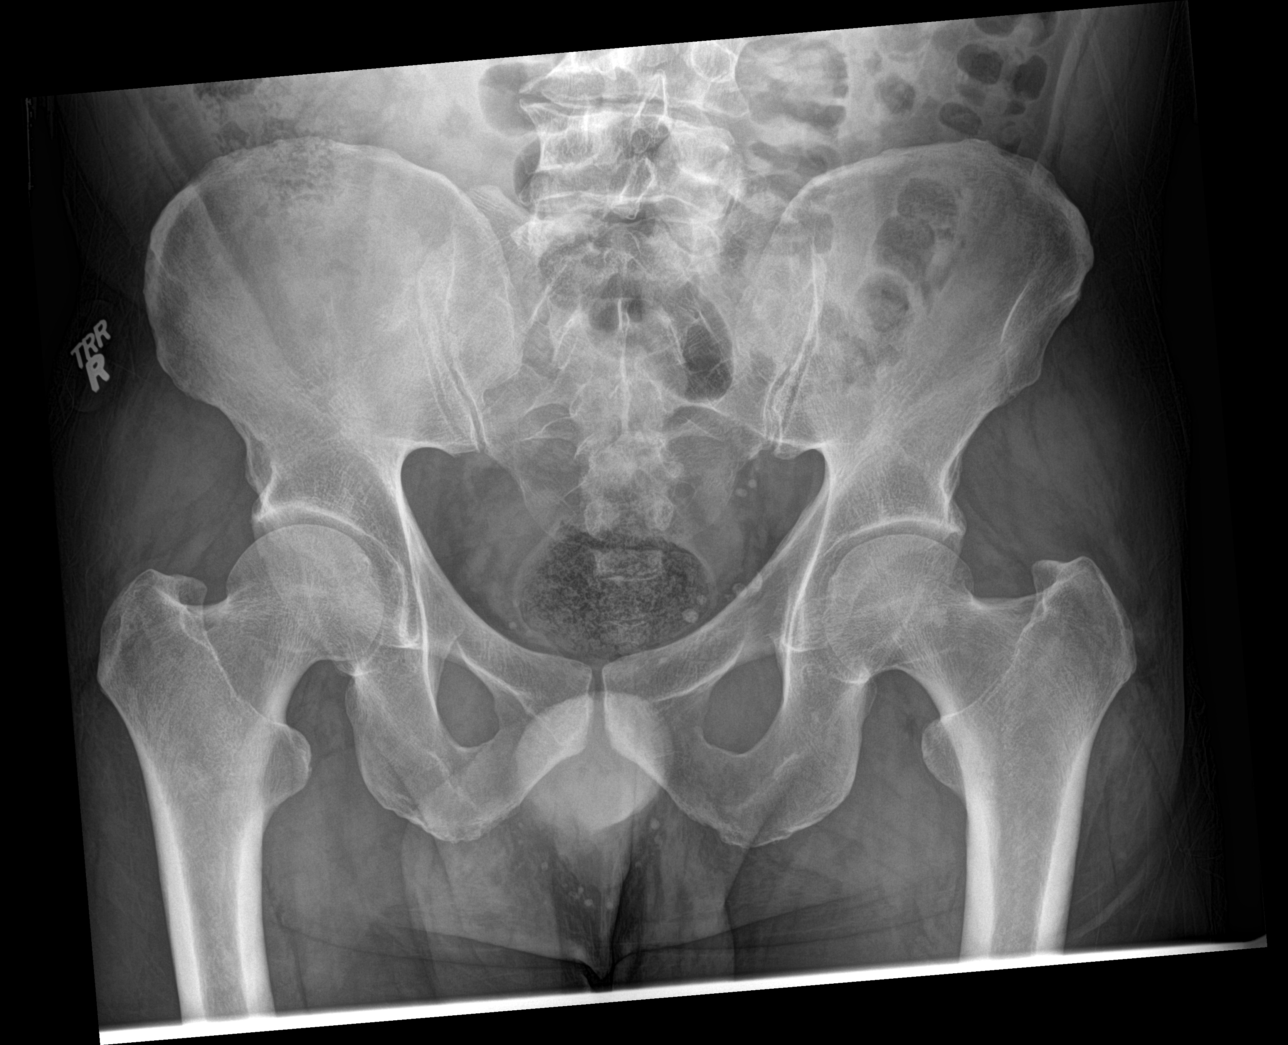
[im 2/3]
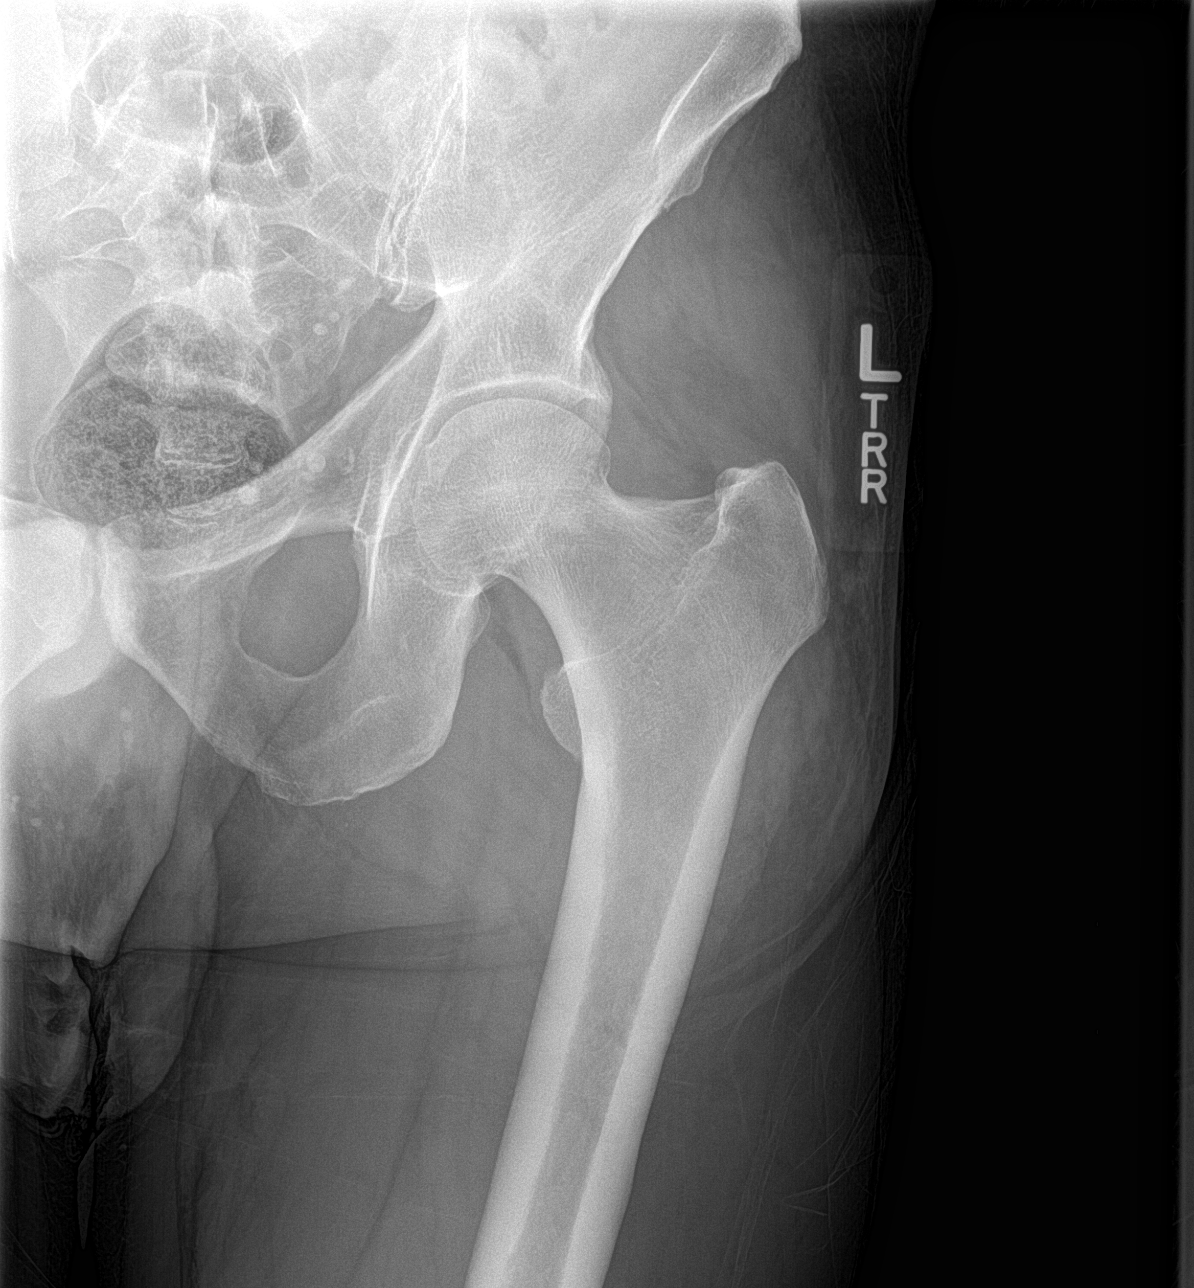
[im 3/3]
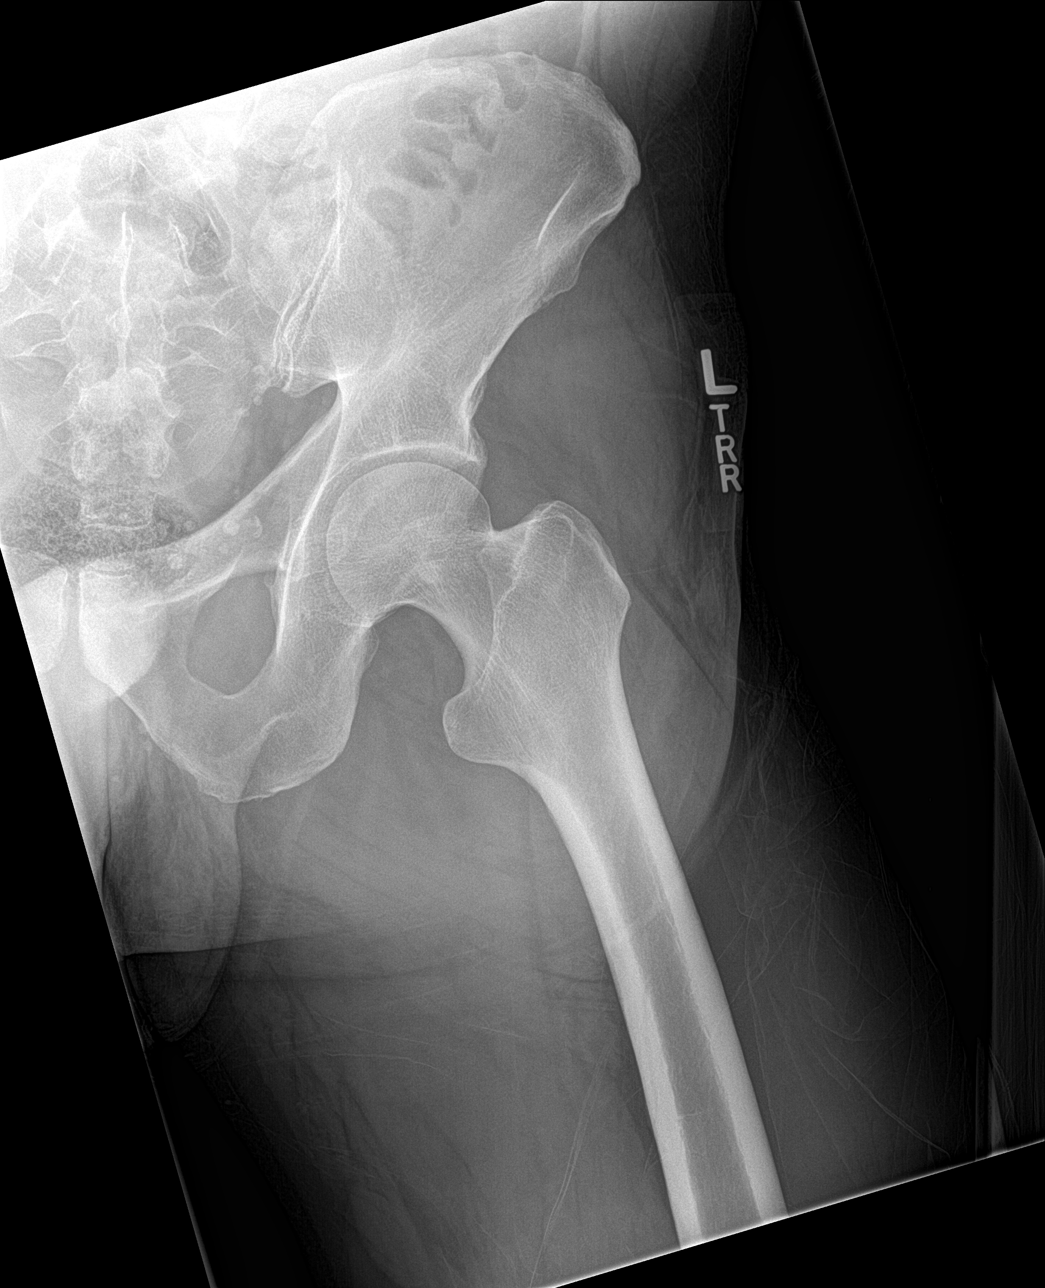

[3 of 3 positions shown; findings below may reference images not displayed]

FINDINGS: Frontal pelvis as well as frontal and lateral left hip images were
obtained. There is no evident fracture or dislocation. The joint
spaces appear normal. No erosive change.
IMPRESSION: No fracture or dislocation.  No evident arthropathy.

## 2018-08-06 ENCOUNTER — Encounter (HOSPITAL_COMMUNITY): Payer: Self-pay | Admitting: Emergency Medicine

## 2018-08-06 ENCOUNTER — Other Ambulatory Visit: Payer: Self-pay

## 2018-08-06 ENCOUNTER — Emergency Department (HOSPITAL_COMMUNITY): Payer: Self-pay

## 2018-08-06 ENCOUNTER — Observation Stay (HOSPITAL_COMMUNITY)
Admission: EM | Admit: 2018-08-06 | Discharge: 2018-08-07 | Disposition: A | Discharge status: Home / Self Care | Payer: Self-pay | Attending: Cardiology | Admitting: Cardiology

## 2018-08-06 DIAGNOSIS — I509 Heart failure, unspecified: Secondary | ICD-10-CM

## 2018-08-06 DIAGNOSIS — I483 Typical atrial flutter: Principal | ICD-10-CM

## 2018-08-06 DIAGNOSIS — I4892 Unspecified atrial flutter: Secondary | ICD-10-CM | POA: Diagnosis present

## 2018-08-06 DIAGNOSIS — R030 Elevated blood-pressure reading, without diagnosis of hypertension: Secondary | ICD-10-CM | POA: Insufficient documentation

## 2018-08-06 LAB — COMPREHENSIVE METABOLIC PANEL
ALBUMIN: 3.4 g/dL — AB (ref 3.5–5.0)
ALT: 93 U/L — AB (ref 0–44)
AST: 36 U/L (ref 15–41)
Alkaline Phosphatase: 70 U/L (ref 38–126)
Anion gap: 13 (ref 5–15)
BUN: 19 mg/dL (ref 6–20)
CALCIUM: 8.5 mg/dL — AB (ref 8.9–10.3)
CO2: 18 mmol/L — AB (ref 22–32)
CREATININE: 0.9 mg/dL (ref 0.61–1.24)
Chloride: 107 mmol/L (ref 98–111)
GFR calc Af Amer: >60 mL/min (ref >60)
GFR calc non Af Amer: >60 mL/min (ref >60)
GLUCOSE: 104 mg/dL — AB (ref 70–99)
Potassium: 3.8 mmol/L (ref 3.5–5.1)
SODIUM: 138 mmol/L (ref 135–145)
Total Bilirubin: 0.7 mg/dL (ref 0.3–1.2)
Total Protein: 7.1 g/dL (ref 6.5–8.1)

## 2018-08-06 LAB — RAPID URINE DRUG SCREEN, HOSP PERFORMED
AMPHETAMINES: NOT DETECTED
Barbiturates: NOT DETECTED
Benzodiazepines: NOT DETECTED
Cocaine: NOT DETECTED
OPIATES: NOT DETECTED
TETRAHYDROCANNABINOL: NOT DETECTED

## 2018-08-06 LAB — CBC WITH DIFFERENTIAL/PLATELET
Abs Immature Granulocytes: 0 10*3/uL (ref 0.0–0.1)
Basophils Absolute: 0 10*3/uL (ref 0.0–0.1)
Basophils Relative: 0 %
EOS PCT: 2 %
Eosinophils Absolute: 0.2 10*3/uL (ref 0.0–0.7)
HEMATOCRIT: 42.7 % (ref 39.0–52.0)
HEMOGLOBIN: 14 g/dL (ref 13.0–17.0)
Immature Granulocytes: 0 %
LYMPHS ABS: 2.7 10*3/uL (ref 0.7–4.0)
LYMPHS PCT: 37 %
MCH: 29.9 pg (ref 26.0–34.0)
MCHC: 32.8 g/dL (ref 30.0–36.0)
MCV: 91.2 fL (ref 78.0–100.0)
MONO ABS: 0.6 10*3/uL (ref 0.1–1.0)
MONOS PCT: 8 %
Neutro Abs: 3.8 10*3/uL (ref 1.7–7.7)
Neutrophils Relative %: 53 %
Platelets: 192 10*3/uL (ref 150–400)
RBC: 4.68 MIL/uL (ref 4.22–5.81)
RDW: 12.8 % (ref 11.5–15.5)
WBC: 7.2 10*3/uL (ref 4.0–10.5)

## 2018-08-06 LAB — PROTIME-INR
INR: 1.06
Prothrombin Time: 13.7 seconds (ref 11.4–15.2)

## 2018-08-06 LAB — ETHANOL: Alcohol, Ethyl (B): <10 mg/dL (ref <10)

## 2018-08-06 LAB — TSH: TSH: 1.984 u[IU]/mL (ref 0.350–4.500)

## 2018-08-06 LAB — TROPONIN I: Troponin I: <0.03 ng/mL (ref <0.03)

## 2018-08-06 LAB — MRSA PCR SCREENING: MRSA BY PCR: NEGATIVE

## 2018-08-06 LAB — BRAIN NATRIURETIC PEPTIDE: B Natriuretic Peptide: 211 pg/mL — ABNORMAL HIGH (ref 0.0–100.0)

## 2018-08-06 MED ORDER — DILTIAZEM LOAD VIA INFUSION
15.0000 mg | Freq: Once | INTRAVENOUS | Status: AC
  Administered 2018-08-06: 15 mg via INTRAVENOUS
  Filled 2018-08-06: qty 15

## 2018-08-06 MED ORDER — ZOLPIDEM TARTRATE 5 MG PO TABS
5.0000 mg | ORAL_TABLET | Freq: Every evening | ORAL | Status: DC | PRN
  Administered 2018-08-06: 5 mg via ORAL
  Filled 2018-08-06: qty 1

## 2018-08-06 MED ORDER — SODIUM CHLORIDE 0.9 % IV BOLUS
500.0000 mL | Freq: Once | INTRAVENOUS | Status: AC
  Administered 2018-08-06: 500 mL via INTRAVENOUS

## 2018-08-06 MED ORDER — ACETAMINOPHEN 325 MG PO TABS: 650.0000 mg | ORAL_TABLET | ORAL | Status: DC | PRN

## 2018-08-06 MED ORDER — FUROSEMIDE 10 MG/ML IJ SOLN
20.0000 mg | Freq: Once | INTRAMUSCULAR | Status: AC
  Administered 2018-08-06: 20 mg via INTRAVENOUS
  Filled 2018-08-06: qty 2

## 2018-08-06 MED ORDER — APIXABAN 5 MG PO TABS
5.0000 mg | ORAL_TABLET | Freq: Two times a day (BID) | ORAL | Status: DC
  Administered 2018-08-06 – 2018-08-07 (×3): 5 mg via ORAL
  Filled 2018-08-06 (×4): qty 1

## 2018-08-06 MED ORDER — DILTIAZEM HCL-DEXTROSE 100-5 MG/100ML-% IV SOLN (PREMIX)
5.0000 mg/h | INTRAVENOUS | Status: DC
  Administered 2018-08-06: 5 mg/h via INTRAVENOUS
  Administered 2018-08-06 – 2018-08-07 (×3): 15 mg/h via INTRAVENOUS
  Filled 2018-08-06 (×6): qty 100

## 2018-08-06 MED ORDER — ONDANSETRON HCL 4 MG/2ML IJ SOLN
4.0000 mg | Freq: Four times a day (QID) | INTRAMUSCULAR | Status: DC | PRN
  Administered 2018-08-07: 4 mg via INTRAVENOUS

## 2018-08-06 NOTE — H&P (Addendum)
History & Physical    Patient ID: Kenneth Marsh MRN: [id number], DOB/AGE: September 30, 1971   Admit date: 08/06/2018   Primary Physician: Patient, No Pcp Per Primary Cardiologist: New  Patient Profile    47 yo male with no known PMH who presented with shortness of breath and found to be in new onset Afib.   Past Medical History   History reviewed. No pertinent past medical history.  History reviewed. No pertinent surgical history.   Allergies  No Known Allergies  History of Present Illness    [name] is a 47 yo male with no known PMH who presented to the ED with several days of chest discomfort and shortness of breath. Reports about 5 days ago he was at work at felt a little dizzy and his vision was dark. A co-worker gave him something to drink to try and help but symptoms improved gradually. Over the next few days he just felt tired and short of breath. Had a cough but nonproductive. Denied any palpitations. Did feel more short of breath if he laid back to sleep. Ultimately presented to the ED this morning with symptoms and found to be in new onset Aflutter with rates in the 140s. Labs in the ED showed stable electrolytes, Cr 0.9, BNP 211, Hgb 14.0. UDS was negative. He was started on a Dilt drip in the ED with some improvement in HR. CXR did show concern for mild edema. Does report some episodes of chest tightness but this is related to when his HR is elevated. Improves when his rate comes down.   Home Medications    Prior to Admission medications   Medication Sig Start Date End Date Taking? Authorizing Provider  Pheniramine-PE-APAP (THERAFLU FLU & SORE THROAT) 20-10-650 MG PACK Take 1 Package by mouth as needed (for cough).   Yes [provider]  cephALEXin (KEFLEX) 500 MG capsule Take 1 capsule (500 mg total) by mouth 4 (four) times daily. Patient not taking: Reported on 08/06/2018 01/12/17   Kenneth Napoleon, NP  ibuprofen (ADVIL,MOTRIN) 800 MG tablet Take 1 tablet (800  mg total) by mouth every 8 (eight) hours as needed (with food). Patient not taking: Reported on 08/06/2018 08/17/16   Rockne Menghini, MD    Family History    Family History  Problem Relation Age of Onset  . Hypertension Father     Social History    Social History   Socioeconomic History  . Marital status: Divorced    Spouse name: Not on file  . Number of children: Not on file  . Years of education: Not on file  . Highest education level: Not on file  Occupational History  . Not on file  Social Needs  . Financial resource strain: Not on file  . Food insecurity:    Worry: Not on file    Inability: Not on file  . Transportation needs:    Medical: Not on file    Non-medical: Not on file  Tobacco Use  . Smoking status: Never Smoker  . Smokeless tobacco: Never Used  Substance and Sexual Activity  . Alcohol use: Yes  . Drug use: No  . Sexual activity: Not on file  Lifestyle  . Physical activity:    Days per week: Not on file    Minutes per session: Not on file  . Stress: Not on file  Relationships  . Social connections:    Talks on phone: Not on file    Gets together: Not on  file    Attends religious service: Not on file    Active member of club or organization: Not on file    Attends meetings of clubs or organizations: Not on file    Relationship status: Not on file  . Intimate partner violence:    Fear of current or ex partner: Not on file    Emotionally abused: Not on file    Physically abused: Not on file    Forced sexual activity: Not on file  Other Topics Concern  . Not on file  Social History Narrative  . Not on file     Review of Systems    All other systems reviewed and are otherwise negative except as noted above.  Physical Exam    Blood pressure (!) 128/95, pulse (!) 145, temperature 98 F (36.7 C), temperature source Oral, resp. rate 18, SpO2 94 %.  General: Pleasant, younger Hispanic male NAD Psych: Normal affect. Neuro: Alert and  oriented X 3. Moves all extremities spontaneously. HEENT: Normal  Neck: Supple without bruits or JVD. Lungs:  Resp regular and unlabored, CTA. Heart: Irreg, tachy no murmurs. Abdomen: Soft, non-tender, non-distended, BS + x 4.  Extremities: No clubbing, cyanosis or edema. DP/PT/Radials 2+ and equal bilaterally.  Labs    Troponin (Point of Care Test) No results for input(s): TROPIPOC in the last 72 hours. Recent Labs    08/06/18 0650  TROPONINI <0.03   Lab Results  Component Value Date   WBC 7.2 08/06/2018   HGB 14.0 08/06/2018   HCT 42.7 08/06/2018   MCV 91.2 08/06/2018   PLT 192 08/06/2018    Recent Labs  Lab 08/06/18 0650  NA 138  K 3.8  CL 107  CO2 18*  BUN 19  CREATININE 0.90  CALCIUM 8.5*  PROT 7.1  BILITOT 0.7  ALKPHOS 70  ALT 93*  AST 36  GLUCOSE 104*   No results found for: CHOL, HDL, LDLCALC, TRIG No results found for: [facility]   Radiology Studies    Dg Chest 2 View  Result Date: 08/06/2018 CLINICAL DATA:  Shortness of breath EXAM: CHEST - 2 VIEW COMPARISON:  None. FINDINGS: Multifocal patchy/interstitial opacities, including in the right upper lobe, lingula, and bilateral lower lobes. No pleural effusion or pneumothorax. The heart is normal in size. Visualized osseous structures are within normal limits. IMPRESSION: Multifocal patchy/interstitial opacities, favoring multifocal pneumonia or possibly interstitial edema. Electronically Signed   By: Charline Bills M.D.   On: 08/06/2018 07:33    ECG & Cardiac Imaging    EKG: Aflutter rate 143  Assessment & Plan    47 yo male with no known PMH who presented with shortness of breath and found to be in new onset Afib.   1. New onset Aflutter: Unclear the exact timing but suspect about 5 days ago when he developed symptoms of shortness of breath and feeling tired. Unclear what his ChadsVasc score is at this time. Blood pressures are elevated slightly in the ED but no dx of HTN. BNP elevated but EF  unknown. TSH in the ED was normal.   -- admit to telemetry -- continue IV Dilt -- add eliquis 5mg  BID -- plan for TEE/DCCV tomorrow pending he does not convert with medications, NPO at midnight  2. Shortness of breath: could be related to elevated rate, though BNP is elevated at 200 and CXR with concern over mild edema.  -- echo pending -- will give dose of IV lasix 20mg  x1 now and follow UOP  3. Elevated blood pressure: Noted in the ED. Improved on Dilt drip. Will follow for now  Severity of Illness: The appropriate patient status for this patient is OBSERVATION. Observation status is judged to be reasonable and necessary in order to provide the required intensity of service to ensure the patient's safety. The patient's presenting symptoms, physical exam findings, and initial radiographic and laboratory data in the context of their medical condition is felt to place them at decreased risk for further clinical deterioration. Furthermore, it is anticipated that the patient will be medically stable for discharge from the hospital within 2 midnights of admission. The following factors support the patient status of observation.   " The patient's presenting symptoms include shortness of breath, chest tightness. " The physical exam findings include normal. " The initial radiographic and laboratory data are EKG with new onset Aflutter.  Janice CoffinSigned, Lindsay Roberts, NP-C Pager [phone] 08/06/2018, 11:16 AM  I have seen and examined this patient with Laverda PageLindsay Roberts.  Agree with above, note added to reflect my findings.  On exam, tachycardic, regular, no murmurs, lungs clear.  To the hospital with shortness of breath.  Found to be in new onset atrial flutter.  He is currently having episodic chest pain associated with flutter and rapid rates.  He is on a diltiazem drip at the moment.  Will order a transthoracic echo to further determine if he has any cause for his atrial flutter.  Due to his episodes  of chest pain, we will set him up for TEE and cardioversion.  We will start him on Eliquis.  Will M. Camnitz MD 08/06/2018 11:56 AM

## 2018-08-06 NOTE — ED Notes (Signed)
Attempted to call report x 1  

## 2018-08-06 NOTE — ED Provider Notes (Addendum)
[facility] Provider Note   CSN: [id number] Arrival date & time: 08/06/18  [id number]     History   Chief Complaint Chief Complaint  Patient presents with  . Shortness of Breath    HPI Kenneth Marsh Tissue is a 47 y.o. male.  HPI Patient presents with shortness of breath myalgias and achiness for the last 4 days.  No real cough but is felt short of breath.  Has been taking TheraFlu without relief.  Does not feel his heart going fast.  No sick contacts.  States he has been having more difficulty breathing.  States he cannot lay flat because of the trouble breathing.  No real swelling in his legs.  Some dull chest pain.  No abdominal pain. History reviewed. No pertinent past medical history.  There are no active problems to display for this patient.   History reviewed. No pertinent surgical history.      Home Medications    Prior to Admission medications   Medication Sig Start Date End Date Taking? Authorizing Provider  Pheniramine-PE-APAP (THERAFLU FLU & SORE THROAT) 20-10-650 MG PACK Take 1 Package by mouth as needed (for cough).   Yes [provider]  cephALEXin (KEFLEX) 500 MG capsule Take 1 capsule (500 mg total) by mouth 4 (four) times daily. Patient not taking: Reported on 08/06/2018 01/12/17   Janne NapoleonNeese, Hope M, NP  ibuprofen (ADVIL,MOTRIN) 800 MG tablet Take 1 tablet (800 mg total) by mouth every 8 (eight) hours as needed (with food). Patient not taking: Reported on 08/06/2018 08/17/16   Rockne MenghiniNorman, Anne-Caroline, MD    Family History No family history on file.  Social History Social History   Tobacco Use  . Smoking status: Never Smoker  . Smokeless tobacco: Never Used  Substance Use Topics  . Alcohol use: Yes  . Drug use: No     Allergies   Patient has no known allergies.   Review of Systems Review of Systems  Constitutional: Positive for appetite change. Negative for fever.  HENT: Negative for congestion.     Respiratory: Positive for shortness of breath.   Cardiovascular: Positive for chest pain.  Gastrointestinal: Negative for abdominal pain.  Genitourinary: Negative for flank pain.  Musculoskeletal: Positive for myalgias.  Neurological: Negative for weakness.  Psychiatric/Behavioral: Negative for confusion.     Physical Exam Updated Vital Signs BP (!) 128/95   Pulse (!) 145   Temp 98 F (36.7 C) (Oral)   Resp 18   SpO2 94%   Physical Exam  Constitutional: He appears well-developed.  HENT:  Head: Normocephalic.  Neck: Neck supple.  Cardiovascular:  Regular tachycardia  Pulmonary/Chest:  Rales bilateral bases.  Abdominal: There is no tenderness.  Musculoskeletal:       Right lower leg: He exhibits no edema.       Left lower leg: He exhibits no edema.  Neurological: He is alert.  Skin: Skin is warm. Capillary refill takes less than 2 seconds.     ED Treatments / Results  Labs (all labs ordered are listed, but only abnormal results are displayed) Labs Reviewed  COMPREHENSIVE METABOLIC PANEL - Abnormal; Notable for the following components:      Result Value   CO2 18 (*)    Glucose, Bld 104 (*)    Calcium 8.5 (*)    Albumin 3.4 (*)    ALT 93 (*)    All other components within normal limits  BRAIN NATRIURETIC PEPTIDE - Abnormal; Notable for the following components:  B Natriuretic Peptide 211.0 (*)    All other components within normal limits  PROTIME-INR  CBC WITH DIFFERENTIAL/PLATELET  TROPONIN I  RAPID URINE DRUG SCREEN, HOSP PERFORMED  ETHANOL  TSH    EKG EKG Interpretation  Date/Time:  Sunday August 06 2018 06:44:20 EDT Ventricular Rate:  143 PR Interval:    QRS Duration: 96 QT Interval:  286 QTC Calculation: 442 R Axis:   105 Text Interpretation:  atrial fib vs flutter Right axis deviation ST depr, consider ischemia, inferior leads Baseline wander in lead(s) V3 Reconfirmed by Benjiman Core [phone]) on 08/06/2018 6:58:07 AM   Radiology Dg  Chest 2 View  Result Date: 08/06/2018 CLINICAL DATA:  Shortness of breath EXAM: CHEST - 2 VIEW COMPARISON:  None. FINDINGS: Multifocal patchy/interstitial opacities, including in the right upper lobe, lingula, and bilateral lower lobes. No pleural effusion or pneumothorax. The heart is normal in size. Visualized osseous structures are within normal limits. IMPRESSION: Multifocal patchy/interstitial opacities, favoring multifocal pneumonia or possibly interstitial edema. Electronically Signed   By: Charline Bills M.D.   On: 08/06/2018 07:33    Procedures Procedures (including critical care time)  Medications Ordered in ED Medications  diltiazem (CARDIZEM) 1 mg/mL load via infusion 15 mg (15 mg Intravenous Bolus from Bag 08/06/18 0806)    And  diltiazem (CARDIZEM) 100 mg in dextrose 5% (1 mg/mL) infusion (15 mg/hr Intravenous Rate/Dose Verify 08/06/18 1101)  sodium chloride 0.9 % bolus 500 mL (0 mLs Intravenous Stopped 08/06/18 0849)     Initial Impression / Assessment and Plan / ED Course  I have reviewed the triage vital signs and the nursing notes.  Pertinent labs & imaging results that were available during my care of the patient were reviewed by me and considered in my medical decision making (see chart for details).     Patient presents with shortness of breath.  Looks like new onset A. fib or flutter with RVR.  I think this likely is causing heart failure.  X-ray shows infiltrate but does not have fever or white count.  I think this likely his edema.  ChadsVasc score is potentially to if he count hypertension since he was hypertensive when he showed up here and CHF since he is now in CHF. IV Cardizem started for rate control.  Discussed with Dr. Elberta Fortis from cardiology, who will see the patient   CRITICAL CARE Performed by: Benjiman Core Total critical care time: 30 minutes Critical care time was exclusive of separately billable procedures and treating other  patients. Critical care was necessary to treat or prevent imminent or life-threatening deterioration. Critical care was time spent personally by me on the following activities: development of treatment plan with patient and/or surrogate as well as nursing, discussions with consultants, evaluation of patient's response to treatment, examination of patient, obtaining history from patient or surrogate, ordering and performing treatments and interventions, ordering and review of laboratory studies, ordering and review of radiographic studies, pulse oximetry and re-evaluation of patient's condition.   Final Clinical Impressions(s) / ED Diagnoses   Final diagnoses:  Acute congestive heart failure, unspecified heart failure type [facility])  Atrial flutter with rapid ventricular response [facility])    ED Discharge Orders    None       Benjiman Core, MD 08/06/18 6045    Benjiman Core, MD 08/06/18 1101

## 2018-08-06 NOTE — ED Notes (Signed)
Pt belongings in single bag at bedside, contains dark shirt and phone charger

## 2018-08-06 NOTE — ED Notes (Signed)
Cardiology at bedside,  

## 2018-08-06 NOTE — Discharge Instructions (Addendum)
You have an appointment set up with the Atrial Fibrillation Clinic.  Multiple studies have shown that being followed by a dedicated atrial fibrillation clinic in addition to the standard care you receive from your other physicians improves health. We believe that enrollment in the atrial fibrillation clinic will allow us to better care for you.  ° °The phone number to the Atrial Fibrillation Clinic is [phone]. The clinic is staffed Monday through Friday from 8:30am to 5pm. ° °Parking Directions: The clinic is located in the Heart and Vascular Building connected to [place] [facility]. °1)From [address] turn on to [address] and go to the 3rd entrance  (Heart and Vascular entrance) on the right. °2)Look to the right for Heart &Vascular Parking Garage. °3)A code for the entrance is required please call the clinic to receive this.   °4)Take the elevators to the 1st floor. Registration is in the room with the glass walls at the end of the hallway. ° °If you have any trouble parking or locating the clinic, please don’t hesitate to call [phone]. ° ° ° °Information on my medicine - ELIQUIS® (apixaban) ° °This medication education was reviewed with me or my healthcare representative as part of my discharge preparation.  ° °Why was Eliquis® prescribed for you? °Eliquis® was prescribed for you to reduce the risk of a blood clot forming that can cause a stroke if you have a medical condition called atrial fibrillation (a type of irregular heartbeat). ° °What do You need to know about Eliquis® ? °Take your Eliquis® TWICE DAILY - one tablet in the morning and one tablet in the evening with or without food. If you have difficulty swallowing the tablet whole please discuss with your pharmacist how to take the medication safely. ° °Take Eliquis® exactly as prescribed by your doctor and DO NOT stop taking Eliquis® without talking to the doctor who prescribed the medication.  Stopping may increase your risk of  developing a stroke.  Refill your prescription before you run out. ° °After discharge, you should have regular check-up appointments with your healthcare provider that is prescribing your Eliquis®.  In the future your dose may need to be changed if your kidney function or weight changes by a significant amount or as you get older. ° °What do you do if you miss a dose? °If you miss a dose, take it as soon as you remember on the same day and resume taking twice daily.  Do not take more than one dose of ELIQUIS at the same time to make up a missed dose. ° °Important Safety Information °A possible side effect of Eliquis® is bleeding. You should call your healthcare provider right away if you experience any of the following: °? Bleeding from an injury or your nose that does not stop. °? Unusual colored urine (red or dark brown) or unusual colored stools (red or black). °? Unusual bruising for unknown reasons. °? A serious fall or if you hit your head (even if there is no bleeding). ° °Some medicines may interact with Eliquis® and might increase your risk of bleeding or clotting while on Eliquis®. To help avoid this, consult your healthcare provider or pharmacist prior to using any new prescription or non-prescription medications, including herbals, vitamins, non-steroidal anti-inflammatory drugs (NSAIDs) and supplements. ° °This website has more information on Eliquis® (apixaban): http://www.eliquis.com/eliquis/home ° °

## 2018-08-06 NOTE — ED Notes (Signed)
Paged admitted provider regarding stepdown bed vs. Tele d/t cardizem titration.

## 2018-08-06 NOTE — ED Notes (Signed)
Pt aware of need for urine sample, urinal at bedside 

## 2018-08-06 NOTE — ED Triage Notes (Signed)
Pt reports some sob, bilateral rib pain and body aches for the past 4 days, denies cough or sore throat, states he took some otc flu meds without relief. resp e/u, nad.

## 2018-08-06 NOTE — ED Notes (Signed)
Patient in Xray

## 2018-08-07 ENCOUNTER — Observation Stay (HOSPITAL_COMMUNITY): Payer: Self-pay | Admitting: Anesthesiology

## 2018-08-07 ENCOUNTER — Encounter (HOSPITAL_COMMUNITY)
Admission: EM | Disposition: A | Discharge status: Home / Self Care | Payer: Self-pay | Source: Home / Self Care | Attending: Emergency Medicine

## 2018-08-07 ENCOUNTER — Other Ambulatory Visit (HOSPITAL_COMMUNITY): Payer: Self-pay

## 2018-08-07 ENCOUNTER — Observation Stay (HOSPITAL_COMMUNITY): Payer: Self-pay

## 2018-08-07 ENCOUNTER — Encounter (HOSPITAL_COMMUNITY): Payer: Self-pay | Admitting: *Deleted

## 2018-08-07 DIAGNOSIS — I4892 Unspecified atrial flutter: Secondary | ICD-10-CM

## 2018-08-07 DIAGNOSIS — I340 Nonrheumatic mitral (valve) insufficiency: Secondary | ICD-10-CM

## 2018-08-07 HISTORY — PX: CARDIOVERSION: SHX1299

## 2018-08-07 HISTORY — PX: TEE WITHOUT CARDIOVERSION: SHX5443

## 2018-08-07 LAB — BASIC METABOLIC PANEL
ANION GAP: 6 (ref 5–15)
BUN: 14 mg/dL (ref 6–20)
CALCIUM: 8.6 mg/dL — AB (ref 8.9–10.3)
CHLORIDE: 107 mmol/L (ref 98–111)
CO2: 24 mmol/L (ref 22–32)
Creatinine, Ser: 0.97 mg/dL (ref 0.61–1.24)
GFR calc non Af Amer: >60 mL/min (ref >60)
GLUCOSE: 116 mg/dL — AB (ref 70–99)
POTASSIUM: 3.9 mmol/L (ref 3.5–5.1)
Sodium: 137 mmol/L (ref 135–145)

## 2018-08-07 LAB — CBC
HEMATOCRIT: 40.6 % (ref 39.0–52.0)
HEMOGLOBIN: 13.4 g/dL (ref 13.0–17.0)
MCH: 29.5 pg (ref 26.0–34.0)
MCHC: 33 g/dL (ref 30.0–36.0)
MCV: 89.2 fL (ref 78.0–100.0)
Platelets: 182 10*3/uL (ref 150–400)
RBC: 4.55 MIL/uL (ref 4.22–5.81)
RDW: 12.8 % (ref 11.5–15.5)
WBC: 6.3 10*3/uL (ref 4.0–10.5)

## 2018-08-07 LAB — HIV ANTIBODY (ROUTINE TESTING W REFLEX): HIV Screen 4th Generation wRfx: NONREACTIVE

## 2018-08-07 SURGERY — ECHOCARDIOGRAM, TRANSESOPHAGEAL
Anesthesia: General

## 2018-08-07 MED ORDER — DILTIAZEM HCL ER COATED BEADS 180 MG PO CP24: 180.0000 mg | ORAL_CAPSULE | Freq: Every day | ORAL | 3 refills | Status: DC

## 2018-08-07 MED ORDER — SODIUM CHLORIDE 0.9 % IV SOLN
INTRAVENOUS | Status: DC | PRN
  Administered 2018-08-07: 10:00:00 via INTRAVENOUS

## 2018-08-07 MED ORDER — APIXABAN 5 MG PO TABS: 5.0000 mg | ORAL_TABLET | Freq: Two times a day (BID) | ORAL | 3 refills | Status: DC

## 2018-08-07 MED ORDER — PROPOFOL 10 MG/ML IV BOLUS
INTRAVENOUS | Status: DC | PRN
  Administered 2018-08-07: 15 mg via INTRAVENOUS

## 2018-08-07 MED ORDER — PROPOFOL 500 MG/50ML IV EMUL
INTRAVENOUS | Status: DC | PRN
  Administered 2018-08-07: 120 ug/kg/min via INTRAVENOUS

## 2018-08-07 MED ORDER — BUTAMBEN-TETRACAINE-BENZOCAINE 2-2-14 % EX AERO
INHALATION_SPRAY | CUTANEOUS | Status: DC | PRN
  Administered 2018-08-07: 2 via TOPICAL

## 2018-08-07 MED ORDER — DILTIAZEM HCL ER COATED BEADS 180 MG PO CP24
180.0000 mg | ORAL_CAPSULE | Freq: Every day | ORAL | Status: DC
  Administered 2018-08-07: 180 mg via ORAL
  Filled 2018-08-07: qty 1

## 2018-08-07 MED FILL — CARTIA XT 180 MG CAPSULE SA: 180 | 30 days supply | Qty: 30 | Fill #0

## 2018-08-07 NOTE — CV Procedure (Signed)
Procedure: TEE  Indication: Atrial flutter  Sedation: Per anesthesiology  Findings: Please see echo section for full report.  Normal LV size with EF 55%, normal wall motion and wall thickness.  Normal RV size and systolic function.  Trivial TR.  Mild-moderate MR.  Trileaflet aortic valve with no stenosis or regurgitation.  Mild left atrial enlargement, no LA appendage thrombus.  Normal right atrium.  Normal caliber aorta with minimal plaque.   May proceed to DCCV.   Marca AnconaDalton Karlton Maya 08/07/2018 10:00 AM

## 2018-08-07 NOTE — Anesthesia Preprocedure Evaluation (Signed)
Anesthesia Evaluation  Patient identified by MRN, date of birth, ID band Patient awake    Reviewed: Allergy & Precautions, H&P , NPO status , Patient's Chart, lab work & pertinent test results  Airway Mallampati: II  TM Distance: >3 FB Neck ROM: Full    Dental no notable dental hx. (+) Teeth Intact, Dental Advisory Given   Pulmonary neg pulmonary ROS,    Pulmonary exam normal breath sounds clear to auscultation       Cardiovascular + dysrhythmias Atrial Fibrillation  Rhythm:Irregular Rate:Normal     Neuro/Psych negative neurological ROS  negative psych ROS   GI/Hepatic negative GI ROS, Neg liver ROS,   Endo/Other  negative endocrine ROS  Renal/GU negative Renal ROS  negative genitourinary   Musculoskeletal   Abdominal   Peds  Hematology negative hematology ROS (+)   Anesthesia Other Findings   Reproductive/Obstetrics negative OB ROS                             Anesthesia Physical Anesthesia Plan  ASA: III  Anesthesia Plan: General   Post-op Pain Management:    Induction: Intravenous  PONV Risk Score and Plan: 2 and Propofol infusion and Treatment may vary due to age or medical condition  Airway Management Planned: Nasal Cannula  Additional Equipment:   Intra-op Plan:   Post-operative Plan:   Informed Consent: I have reviewed the patients History and Physical, chart, labs and discussed the procedure including the risks, benefits and alternatives for the proposed anesthesia with the patient or authorized representative who has indicated his/her understanding and acceptance.   Dental advisory given  Plan Discussed with: CRNA  Anesthesia Plan Comments:         Anesthesia Quick Evaluation

## 2018-08-07 NOTE — Progress Notes (Signed)
Progress Note  Patient Name: Kenneth Marsh Date of Encounter: 08/07/2018  Primary Cardiologist: No primary care provider on file. new  Subjective   Feeling well, improved from yesterday iwthout chest pain.  Inpatient Medications    Scheduled Meds: . apixaban  5 mg Oral BID  . diltiazem  180 mg Oral Daily   Continuous Infusions:  PRN Meds: acetaminophen, ondansetron (ZOFRAN) IV, zolpidem   Vital Signs    Vitals:   08/07/18 1005 08/07/18 1020 08/07/18 1025 08/07/18 1040  BP: 92/66 (!) 86/67 (!) 86/74 99/72  Pulse:  68 72 67  Resp: 18 15 15 16   Temp: 97.7 F (36.5 C)     TempSrc: Oral     SpO2: 94% 95% 94%   Weight:      Height:        Intake/Output Summary (Last 24 hours) at 08/07/2018 1212 Last data filed at 08/07/2018 1000 Gross per 24 hour  Intake 844.16 ml  Output 1575 ml  Net -730.84 ml   Filed Weights   08/06/18 1327 08/06/18 1434 08/07/18 0400  Weight: 102.1 kg 98.8 kg 98.6 kg    Telemetry    Atrial flutter - Personally Reviewed  ECG    Atrial flutter - Personally Reviewed  Physical Exam   GEN: No acute distress.   Neck: No JVD Cardiac: iRRR, no murmurs, rubs, or gallops.  Respiratory: Clear to auscultation bilaterally. GI: Soft, nontender, non-distended  MS: No edema; No deformity. Neuro:  Nonfocal  Psych: Normal affect   Labs    Chemistry Recent Labs  Lab 08/06/18 0650 08/07/18 0634  NA 138 137  K 3.8 3.9  CL 107 107  CO2 18* 24  GLUCOSE 104* 116*  BUN 19 14  CREATININE 0.90 0.97  CALCIUM 8.5* 8.6*  PROT 7.1  --   ALBUMIN 3.4*  --   AST 36  --   ALT 93*  --   ALKPHOS 70  --   BILITOT 0.7  --   GFRNONAA >60 >60  GFRAA >60 >60  ANIONGAP 13 6     Hematology Recent Labs  Lab 08/06/18 0650 08/07/18 0634  WBC 7.2 6.3  RBC 4.68 4.55  HGB 14.0 13.4  HCT 42.7 40.6  MCV 91.2 89.2  MCH 29.9 29.5  MCHC 32.8 33.0  RDW 12.8 12.8  PLT 192 182    Cardiac Enzymes Recent Labs  Lab 08/06/18 0650  TROPONINI  <0.03   No results for input(s): TROPIPOC in the last 168 hours.   BNP Recent Labs  Lab 08/06/18 0650  BNP 211.0*     DDimer No results for input(s): DDIMER in the last 168 hours.   Radiology    Dg Chest 2 View  Result Date: 08/06/2018 CLINICAL DATA:  Shortness of breath EXAM: CHEST - 2 VIEW COMPARISON:  None. FINDINGS: Multifocal patchy/interstitial opacities, including in the right upper lobe, lingula, and bilateral lower lobes. No pleural effusion or pneumothorax. The heart is normal in size. Visualized osseous structures are within normal limits. IMPRESSION: Multifocal patchy/interstitial opacities, favoring multifocal pneumonia or possibly interstitial edema. Electronically Signed   By: Charline Bills M.D.   On: 08/06/2018 07:33    Cardiac Studies   TEE 08/07/18 EF 55%, mild-moderate MR  Patient Profile     47 y.o. male who presented to the hospital in atrial flutter.  Assessment & Plan    1. Typical atrial flutter: no further chest pain. Had TEE and CV with return to sinus rhythm. Kazimir Hartnett  potentially plan for discharge today if remains in sinus rhythm and feels well. Gaige Fussner have follow up in AF clinic and in EP clinic for possible ablation discussion.  2. Elevated BP: no history of hypertension. Marthella Osorno re-evaluate after cardioversion.  For questions or updates, please contact [facility] Please consult [email or web address] for contact info under        Signed, Lorijean Husser Jorja LoaMartin Clarise Chacko, MD  08/07/2018, 12:12 PM

## 2018-08-07 NOTE — Anesthesia Procedure Notes (Signed)
Procedure Name: General with mask airway Date/Time: 08/07/2018 9:43 AM Performed by: White, Cordella RegisterKelsey Tena Lashika Erker, CRNA Pre-anesthesia Checklist: Patient identified, Emergency Drugs available, Suction available and Patient being monitored Patient Re-evaluated:Patient Re-evaluated prior to induction Oxygen Delivery Method: Nasal cannula Preoxygenation: Pre-oxygenation with 100% oxygen

## 2018-08-07 NOTE — Transfer of Care (Signed)
Immediate Anesthesia Transfer of Care Note  Patient: Kenneth Marsh  Procedure(s) Performed: TRANSESOPHAGEAL ECHOCARDIOGRAM (TEE) (N/A ) CARDIOVERSION (N/A )  Patient Location: Endoscopy Unit  Anesthesia Type:General  Level of Consciousness: drowsy and responds to stimulation  Airway & Oxygen Therapy: Patient Spontanous Breathing and Patient connected to nasal cannula oxygen  Post-op Assessment: Report given to RN and Post -op Vital signs reviewed and stable  Post vital signs: Reviewed and stable  Last Vitals:  Vitals Value Taken Time  BP    Temp    Pulse    Resp    SpO2      Last Pain:  Vitals:   08/07/18 0921  TempSrc: Oral  PainSc: 0-No pain      Patients Stated Pain Goal: 0 (08/07/18 0800)  Complications: No apparent anesthesia complications

## 2018-08-07 NOTE — Anesthesia Postprocedure Evaluation (Signed)
Anesthesia Post Note  Patient: Kaseem Earley AbideCandido Zegers  Procedure(s) Performed: TRANSESOPHAGEAL ECHOCARDIOGRAM (TEE) (N/A ) CARDIOVERSION (N/A )     Patient location during evaluation: PACU Anesthesia Type: General Level of consciousness: awake and alert Pain management: pain level controlled Vital Signs Assessment: post-procedure vital signs reviewed and stable Respiratory status: spontaneous breathing, nonlabored ventilation and respiratory function stable Cardiovascular status: blood pressure returned to baseline and stable Postop Assessment: no apparent nausea or vomiting Anesthetic complications: no    Last Vitals:  Vitals:   08/07/18 1020 08/07/18 1025  BP: (!) 86/67 (!) 86/74  Pulse: 68 72  Resp: 15 15  Temp:    SpO2: 95% 94%    Last Pain:  Vitals:   08/07/18 1025  TempSrc:   PainSc: 0-No pain                 Liam Cammarata,W. EDMOND

## 2018-08-07 NOTE — Care Management Note (Signed)
Case Management Note  Patient Details  Name: Kenneth Marsh MRN: [id number] Date of Birth: [birth year]  Subjective/Objective:   Pt presented for Atrial Flutter s/p cardioversion. Pt is without Insurance and he has no PCP. Pt states he works and he has money to afford medications. CM [facility] to see if hospital follow up can be scheduled.             Action/Plan: Pt will be on Eliquis-Transitions of Care Pharmacy will provide medications to the bedside before transitioning home. [facility] with Eliquis- Cardiology office may have samples of Eliquis if patient will only need for 3 months. [facility]. No further needs from CM at this time.   Expected Discharge Date:                  Expected Discharge Plan:  Home/Self Care  In-House Referral:  PCP / [facility] Connect  Discharge planning Services  CM Consult, Follow-up appt scheduled, Indigent Health Clinic, Medication Assistance  Post Acute Care Choice:  NA Choice offered to:  NA  DME Arranged:  N/A DME Agency:  NA  HH Arranged:  NA HH Agency:  NA  Status of Service:  Completed, signed off  If discussed at Long Length of Stay Meetings, dates discussed:    Additional Comments:  Gala LewandowskyGraves-Bigelow, Waverley Krempasky Kaye, RN 08/07/2018, 11:32 AM

## 2018-08-07 NOTE — Progress Notes (Signed)
  Echocardiogram Echocardiogram Transesophageal has been performed.  Kenneth Marsh, [name] M 08/07/2018, 10:24 AM

## 2018-08-07 NOTE — Procedures (Signed)
Electrical Cardioversion Procedure Note Kenneth Kenneth Marsh Kenneth Marsh [id number] 1971/05/17  Procedure: Electrical Cardioversion Indications:  Atrial Flutter  Procedure Details Consent: Risks of procedure as well as the alternatives and risks of each were explained to the (patient/caregiver).  Consent for procedure obtained. Time Out: Verified patient identification, verified procedure, site/side was marked, verified correct patient position, special equipment/implants available, medications/allergies/relevent history reviewed, required imaging and test results available.  Performed  Patient placed on cardiac monitor, pulse oximetry, supplemental oxygen as necessary.  Sedation given: Propofol per anesthesiology Pacer pads placed anterior and posterior chest.  Cardioverted 1 time(s).  Cardioverted at 200J.  Evaluation Findings: Post procedure EKG shows: NSR Complications: None Patient did tolerate procedure well.   Kenneth Kenneth Kenneth Marsh 08/07/2018, 10:00 AM

## 2018-08-07 NOTE — Discharge Summary (Addendum)
ELECTROPHYSIOLOGY PROCEDURE DISCHARGE SUMMARY    Patient ID: Kenneth Marsh,  MRN: [id number], DOB/AGE: [birth year] 47 y.o.  Admit date: 08/06/2018 Discharge date: 08/07/2018  Primary Care Physician: Patient, No Pcp Per  Electrophysiologist: [facility], Dr. Elberta Fortiscamnitz  Primary Discharge Diagnosis:  1. New on set AFlutter, RVR     CHA2DS2Vasc is zero, started on Eliquis here  Secondary Discharge Diagnosis:  none  No Known Allergies   Procedures This Admission:  1. 08/07/18, DCCV  Brief HPI: Kenneth Marsh is a 47 y.o. male with no known prior medical history, [facility] several days of chest discomfort and shortness of breath, about 5 days prior to his admission he was at work at felt a little dizzy and his vision was dark, co-worker gave him something to drink to try and help but symptoms improved gradually. Over the next few days he just felt tired and short of breath. Had a cough but nonproductive. Denied any palpitations. Did feel more short of breath if he laid back to sleep, finally coming in for evaluation.  He was found to be in new onset Aflutter with rates in the 140s. Labs in the ED showed stable electrolytes, Cr 0.9, BNP 211, Hgb 14.0. UDS was negative. He was started on a Dilt drip in the ED with some improvement in HR. CXR did show concern for mild edema, admitted.  Hospital Course:  The patient reported some episodes of chest tightness but this is related to when his HR is elevated. Improves when his rate comes down, a POC Trop was negative.  BNP mildly elevated and given a dose of IV lasix.  He was started on Eliquis for a/c with plans for TEE/DCCV.  He underwent TEE with LVEF 55%, normal wall motion, trivial TR, mildd-mod MR, mild LA enlargement, no thrombus followed by DCCV successfully restoring SR.  He was transitioned to PO diltiazem.  His BP initially reported high though on diltiazem gtt was 113/80 on PO diltiazem, ambulated in the hallway without  difficulty.  He is s/p DCCV, a one month supply of Eliquis from our office was given to the patient.   Importance of medicine compliance discussed. Case manager has gotten him [facility] who Cheron Pasquarelli likely be able to assist with cost/medicines, and pending Medicaid hopefully as well.  Follow up with the AFib clinic in 2 weeks, and Dr. Elberta Fortisamnitz in 4 weeks to visit ablation.  Transition pharmacy is providing diltiazem at discharge.  The patient was seen and examined by Dr. Elberta Fortisamnitz this morning and considered stable for discharge to home post DCCV.    Physical Exam: Vitals:   08/07/18 1005 08/07/18 1020 08/07/18 1025 08/07/18 1040  BP: 92/66 (!) 86/67 (!) 86/74 99/72  Pulse:  68 72 67  Resp: 18 15 15 16   Temp: 97.7 F (36.5 C)     TempSrc: Oral     SpO2: 94% 95% 94%   Weight:      Height:        GEN- The patient is well appearing, alert and oriented x 3 today.   HEENT: normocephalic, atraumatic; sclera clear, conjunctiva pink; hearing intact; oropharynx clear; neck supple, no JVP Lungs- CTA b/l, normal work of breathing.  No wheezes, rales, rhonchi Heart- RRR, no murmurs, rubs or gallops, PMI not laterally displaced GI- soft, non-tender, non-distended Extremities- no clubbing, cyanosis, or edema MS- no significant deformity or atrophy Skin- warm and dry, no rash or lesion Psych- euthymic  mood, full affect Neuro- no gross deficits   Labs:   Lab Results  Component Value Date   WBC 6.3 08/07/2018   HGB 13.4 08/07/2018   HCT 40.6 08/07/2018   MCV 89.2 08/07/2018   PLT 182 08/07/2018    Recent Labs  Lab 08/06/18 0650 08/07/18 0634  NA 138 137  K 3.8 3.9  CL 107 107  CO2 18* 24  BUN 19 14  CREATININE 0.90 0.97  CALCIUM 8.5* 8.6*  PROT 7.1  --   BILITOT 0.7  --   ALKPHOS 70  --   ALT 93*  --   AST 36  --   GLUCOSE 104* 116*    Discharge Medications:  Allergies as of 08/07/2018   No Known Allergies     Medication List    STOP taking these  medications   cephALEXin 500 MG capsule Commonly known as:  KEFLEX   ibuprofen 800 MG tablet Commonly known as:  ADVIL,MOTRIN     TAKE these medications   apixaban 5 MG Tabs tablet Commonly known as:  ELIQUIS Take 1 tablet (5 mg total) by mouth 2 (two) times daily.   diltiazem 180 MG 24 hr capsule Commonly known as:  CARDIZEM CD Take 1 capsule (180 mg total) by mouth daily. Start taking on:  08/08/2018   THERAFLU FLU & SORE THROAT 20-10-650 MG Pack Generic drug:  Pheniramine-PE-APAP Take 1 Package by mouth as needed (for cough).       Disposition:  Home Discharge Instructions    Diet - low sodium heart healthy   Complete by:  As directed    Increase activity slowly   Complete by:  As directed      Follow-up Information    [facility] Follow up on 08/28/2018.   Why:  @ 1:50 pm with MD Glennis Brink for hospital follow up. Please utilize the pharmacy onsite and medications Ravleen Ries range from $4.00-$10.00.  Contact information: [address] [place] [address] [phone]       [facility] Follow up on 08/18/2018.   Specialty:  Cardiology Why:  11:00AM Contact information: [address] [id number] [place] [address] [place] [address] [phone]       Regan Lemming, MD Follow up on 09/07/2018.   Specialty:  Cardiology Why:  8:45AM Contact information: [address] [place] [address] [phone]           Duration of Discharge Encounter: Greater than 30 minutes including physician time.  Norma Fredrickson, PA-C 08/07/2018 4:24 PM   I have seen and examined this patient with Francis Dowse.  Agree with above, note added to reflect my findings.  On exam, RRR, no murmurs, lungs clear. Admitted for atrial flutter. S/p TEE/CV. Remains in sinus rhythm. Plan for discharge with follow up in clinic.    Terez Montee M. Maysoon Lozada MD 08/08/2018 7:29 AM

## 2018-08-07 NOTE — Interval H&P Note (Signed)
History and Physical Interval Note:  08/07/2018 9:48 AM  Kenneth Earley AbideCandido Marsh  has presented today for surgery, with the diagnosis of fib  The various methods of treatment have been discussed with the patient and family. After consideration of risks, benefits and other options for treatment, the patient has consented to  Procedure(s): TRANSESOPHAGEAL ECHOCARDIOGRAM (TEE) (N/A) CARDIOVERSION (N/A) as a surgical intervention .  The patient's history has been reviewed, patient examined, no change in status, stable for surgery.  I have reviewed the patient's chart and labs.  Questions were answered to the patient's satisfaction.     Perry Molla [employer]

## 2018-08-18 ENCOUNTER — Ambulatory Visit (HOSPITAL_COMMUNITY)
Admit: 2018-08-18 | Discharge: 2018-08-18 | Disposition: A | Discharge status: Home / Self Care | Payer: Self-pay | Source: Ambulatory Visit | Attending: Nurse Practitioner | Admitting: Nurse Practitioner

## 2018-08-18 ENCOUNTER — Encounter (HOSPITAL_COMMUNITY): Payer: Self-pay | Admitting: Nurse Practitioner

## 2018-08-18 VITALS — BP 122/70 | HR 76 | Ht 69.0 in | Wt 212.0 lb

## 2018-08-18 DIAGNOSIS — Z7901 Long term (current) use of anticoagulants: Secondary | ICD-10-CM | POA: Insufficient documentation

## 2018-08-18 DIAGNOSIS — I483 Typical atrial flutter: Secondary | ICD-10-CM | POA: Insufficient documentation

## 2018-08-18 DIAGNOSIS — Z79899 Other long term (current) drug therapy: Secondary | ICD-10-CM | POA: Insufficient documentation

## 2018-08-18 DIAGNOSIS — R030 Elevated blood-pressure reading, without diagnosis of hypertension: Secondary | ICD-10-CM | POA: Insufficient documentation

## 2018-08-18 DIAGNOSIS — Z8249 Family history of ischemic heart disease and other diseases of the circulatory system: Secondary | ICD-10-CM | POA: Insufficient documentation

## 2018-08-18 HISTORY — DX: Unspecified atrial flutter: I48.92

## 2018-08-18 NOTE — Patient Instructions (Signed)
Cardiac Ablation  Cardiac ablation is a procedure to disable (ablate) a small amount of heart tissue in very specific places. The heart has many electrical connections. Sometimes these connections are abnormal and can cause the heart to beat very fast or irregularly. Ablating some of the problem areas can improve the heart rhythm or return it to normal. Ablation may be done for people who:   Have Wolff-Parkinson-White syndrome.   Have fast heart rhythms (tachycardia).   Have taken medicines for an abnormal heart rhythm (arrhythmia) that were not effective or caused side effects.   Have a high-risk heartbeat that may be life-threatening.    During the procedure, a small incision is made in the neck or the groin, and a long, thin, flexible tube (catheter) is inserted into the incision and moved to the heart. Small devices (electrodes) on the tip of the catheter will send out electrical currents. A type of X-ray (fluoroscopy) will be used to help guide the catheter and to provide images of the heart.  Tell a health care provider about:   Any allergies you have.   All medicines you are taking, including vitamins, herbs, eye drops, creams, and over-the-counter medicines.   Any problems you or family members have had with anesthetic medicines.   Any blood disorders you have.   Any surgeries you have had.   Any medical conditions you have, such as kidney failure.   Whether you are pregnant or may be pregnant.  What are the risks?  Generally, this is a safe procedure. However, problems may occur, including:   Infection.   Bruising and bleeding at the catheter insertion site.   Bleeding into the chest, especially into the sac that surrounds the heart. This is a serious complication.   Stroke or blood clots.   Damage to other structures or organs.   Allergic reaction to medicines or dyes.   Need for a permanent pacemaker if the normal electrical system is damaged. A pacemaker is a small computer that sends  electrical signals to the heart and helps your heart beat normally.   The procedure not being fully effective. This may not be recognized until months later. Repeat ablation procedures are sometimes required.    What happens before the procedure?   Follow instructions from your health care provider about eating or drinking restrictions.   Ask your health care provider about:  ? Changing or stopping your regular medicines. This is especially important if you are taking diabetes medicines or blood thinners.  ? Taking medicines such as aspirin and ibuprofen. These medicines can thin your blood. Do not take these medicines before your procedure if your health care provider instructs you not to.   Plan to have someone take you home from the hospital or clinic.   If you will be going home right after the procedure, plan to have someone with you for 24 hours.  What happens during the procedure?   To lower your risk of infection:  ? Your health care team will wash or sanitize their hands.  ? Your skin will be washed with soap.  ? Hair may be removed from the incision area.   An IV tube will be inserted into one of your veins.   You will be given a medicine to help you relax (sedative).   The skin on your neck or groin will be numbed.   An incision will be made in your neck or your groin.   A needle will be inserted   through the incision and into a large vein in your neck or groin.   A catheter will be inserted into the needle and moved to your heart.   Dye may be injected through the catheter to help your surgeon see the area of the heart that needs treatment.   Electrical currents will be sent from the catheter to ablate heart tissue in desired areas. There are three types of energy that may be used to ablate heart tissue:  ? Heat (radiofrequency energy).  ? Laser energy.  ? Extreme cold (cryoablation).   When the necessary tissue has been ablated, the catheter will be removed.   Pressure will be held on the  catheter insertion area to prevent excessive bleeding.   A bandage (dressing) will be placed over the catheter insertion area.  The procedure may vary among health care providers and hospitals.  What happens after the procedure?   Your blood pressure, heart rate, breathing rate, and blood oxygen level will be monitored until the medicines you were given have worn off.   Your catheter insertion area will be monitored for bleeding. You will need to lie still for a few hours to ensure that you do not bleed from the catheter insertion area.   Do not drive for 24 hours or as long as directed by your health care provider.  Summary   Cardiac ablation is a procedure to disable (ablate) a small amount of heart tissue in very specific places. Ablating some of the problem areas can improve the heart rhythm or return it to normal.   During the procedure, electrical currents will be sent from the catheter to ablate heart tissue in desired areas.  This information is not intended to replace advice given to you by your health care provider. Make sure you discuss any questions you have with your health care provider.  Document Released: 03/20/2009 Document Revised: 09/20/2016 Document Reviewed: 09/20/2016  Elsevier Interactive Patient Education  2018 Elsevier Inc[employer]

## 2018-08-21 ENCOUNTER — Encounter (HOSPITAL_COMMUNITY): Payer: Self-pay | Admitting: Nurse Practitioner
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
## 2018-09-07 ENCOUNTER — Ambulatory Visit: Payer: Self-pay | Admitting: Cardiology

## 2018-09-26 ENCOUNTER — Ambulatory Visit: Payer: Self-pay | Admitting: Cardiology

## 2018-09-26 NOTE — Progress Notes (Deleted)
Electrophysiology Office Note   Date:  09/26/2018   ID:  Kenneth Marsh, Kenneth Marsh August 18, 1971, MRN [id number]  PCP:  Patient, No Pcp Per  Cardiologist:   Primary Electrophysiologist:  Ibrahem Volkman Jorja Loa, MD    No chief complaint on file.    History of Present Illness: Kenneth Marsh is a 47 y.o. male who is being seen today for the evaluation of atrial flutter. Presenting today for electrophysiology evaluation.  He presented to the hospital 08/06/2018 with complaints of palpitations and exercise intolerance.  He was found to be in newly diagnosed atrial flutter.  He underwent TEE guided cardioversion with return to sinus rhythm.  He was put on Eliquis.  Today, he denies*** symptoms of palpitations, chest pain, shortness of breath, orthopnea, PND, lower extremity edema, claudication, dizziness, presyncope, syncope, bleeding, or neurologic sequela. The patient is tolerating medications without difficulties.    Past Medical History:  Diagnosis Date  . Atrial flutter [facility])    Past Surgical History:  Procedure Laterality Date  . CARDIOVERSION N/A 08/07/2018   Procedure: CARDIOVERSION;  Surgeon: Laurey Morale, MD;  Location: [facility] ENDOSCOPY;  Service: Cardiovascular;  Laterality: N/A;  . TEE WITHOUT CARDIOVERSION N/A 08/07/2018   Procedure: TRANSESOPHAGEAL ECHOCARDIOGRAM (TEE);  Surgeon: Laurey Morale, MD;  Location: [facility] ENDOSCOPY;  Service: Cardiovascular;  Laterality: N/A;     Current Outpatient Medications  Medication Sig Dispense Refill  . apixaban (ELIQUIS) 5 MG TABS tablet Take 1 tablet (5 mg total) by mouth 2 (two) times daily. 60 tablet 3  . diltiazem (CARDIZEM CD) 180 MG 24 hr capsule Take 1 capsule (180 mg total) by mouth daily. (Patient not taking: Reported on 08/18/2018) 30 capsule 3   No current facility-administered medications for this visit.     Allergies:   Patient has no known allergies.   Social History:  The patient  reports that he has never smoked. He  has never used smokeless tobacco. He reports that he drinks alcohol. He reports that he does not use drugs.   Family History:  The patient's family history includes Hypertension in his father.    ROS:  Please see the history of present illness.   Otherwise, review of systems is positive for ***.   All other systems are reviewed and negative.    PHYSICAL EXAM: VS:  There were no vitals taken for this visit. , BMI There is no height or weight on file to calculate BMI. GEN: Well nourished, well developed, in no acute distress  HEENT: normal  Neck: no JVD, carotid bruits, or masses Cardiac: ***RRR; no murmurs, rubs, or gallops,no edema  Respiratory:  clear to auscultation bilaterally, normal work of breathing GI: soft, nontender, nondistended, + BS MS: no deformity or atrophy  Skin: warm and dry Neuro:  Strength and sensation are intact Psych: euthymic mood, full affect  EKG:  EKG {ACTION; IS/IS [id number]} ordered today. Personal review of the ekg ordered shows ***  Recent Labs: 08/06/2018: ALT 93; B Natriuretic Peptide 211.0; TSH 1.984 08/07/2018: BUN 14; Creatinine, Ser 0.97; Hemoglobin 13.4; Platelets 182; Potassium 3.9; Sodium 137    Lipid Panel  No results found for: CHOL, TRIG, HDL, CHOLHDL, VLDL, LDLCALC, LDLDIRECT   Wt Readings from Last 3 Encounters:  08/18/18 212 lb (96.2 kg)  08/07/18 217 lb 4.8 oz (98.6 kg)  01/11/17 220 lb (99.8 kg)      Other studies Reviewed: Additional studies/ records that were reviewed today include: TEE 08/14/18  Review of the above records  today demonstrates:  - Left ventricle: The cavity size was normal. Systolic function was   normal. The estimated ejection fraction was 55%. Wall motion was   normal; there were no regional wall motion abnormalities. No   evidence of thrombus. - Aortic valve: There was no stenosis. - Aorta: Normal caliber aorta with minimal plaque. - Mitral valve: There was mild to moderate regurgitation. - Left  atrium: The atrium was mildly dilated. No evidence of   thrombus in the atrial cavity or appendage. No evidence of   thrombus in the atrial cavity or appendage. - Right ventricle: The cavity size was normal. Systolic function   was normal. - Right atrium: No evidence of thrombus in the atrial cavity or   appendage.   ASSESSMENT AND PLAN:  1.  Typical atrial flutter: Currently on Eliquis.  He has had a TEE and cardioversion.  I spoke with him about either continuing Eliquis with rate control versus ablation.  Risks and benefits of ablation were discussed.  Risks include bleeding, tamponade, heart block, stroke, among others.***    Current medicines are reviewed at length with the patient today.   The patient does not have concerns regarding his medicines.  The following changes were made today:  {NONE DEFAULTED:18576::"none"}  Labs/ tests ordered today include: *** No orders of the defined types were placed in this encounter.    Disposition:   FU with Vaness Jelinski {gen number [id number]:[id number]} months  Signed, Varina Hulon Jorja LoaMartin Lonetta Blassingame, MD  09/26/2018 8:21 AM     [facility] [address] [place] [address] [phone] (office) [phone] (fax)

## 2018-09-27 ENCOUNTER — Encounter: Payer: Self-pay | Admitting: Cardiology

## 2018-10-03 ENCOUNTER — Encounter: Payer: Self-pay | Admitting: Cardiology

## 2018-12-19 ENCOUNTER — Ambulatory Visit: Payer: Self-pay | Admitting: Cardiology

## 2018-12-19 NOTE — Progress Notes (Deleted)
Electrophysiology Office Note   Date:  12/19/2018   ID:  Kenneth Marsh, DOB 05-21-1971, MRN [id number]  PCP:  Patient, No Pcp Per  Cardiologist:   Primary Electrophysiologist:  Will Jorja LoaMartin Camnitz, MD    No chief complaint on file.    History of Present Illness: Kenneth Marsh is a 48 y.o. male who is being seen today for the evaluation of atrial flutter. Presenting today for electrophysiology evaluation.  He was [facility] with chest discomfort.  He was found to be in atrial flutter with heart rates of 140.  He was started on Eliquis and received TEE and cardioversion.  He was scheduled for ablation 4 weeks later, but missed his appointment.  Today, he denies*** symptoms of palpitations, chest pain, shortness of breath, orthopnea, PND, lower extremity edema, claudication, dizziness, presyncope, syncope, bleeding, or neurologic sequela. The patient is tolerating medications without difficulties.    Past Medical History:  Diagnosis Date  . Atrial flutter [facility](HCC)    Past Surgical History:  Procedure Laterality Date  . CARDIOVERSION N/A 08/07/2018   Procedure: CARDIOVERSION;  Surgeon: Laurey MoraleMcLean, [name] S, MD;  [facility];  Service: Cardiovascular;  Laterality: N/A;  . TEE WITHOUT CARDIOVERSION N/A 08/07/2018   Procedure: TRANSESOPHAGEAL ECHOCARDIOGRAM (TEE);  Surgeon: Laurey MoraleMcLean, [name] S, MD;  [facility];  Service: Cardiovascular;  Laterality: N/A;     Current Outpatient Medications  Medication Sig Dispense Refill  . apixaban (ELIQUIS) 5 MG TABS tablet Take 1 tablet (5 mg total) by mouth 2 (two) times daily. 60 tablet 3  . diltiazem (CARDIZEM CD) 180 MG 24 hr capsule Take 1 capsule (180 mg total) by mouth daily. (Patient not taking: Reported on 08/18/2018) 30 capsule 3   No current facility-administered medications for this visit.     Allergies:   Patient has no known allergies.   Social History:  The patient   reports that he has never smoked. He has never used smokeless tobacco. He reports current alcohol use. He reports that he does not use drugs.   Family History:  The patient's family history includes Hypertension in his father.    ROS:  Please see the history of present illness.   Otherwise, review of systems is positive for ***.   All other systems are reviewed and negative.    PHYSICAL EXAM: VS:  There were no vitals taken for this visit. , BMI There is no height or weight on file to calculate BMI. GEN: Well nourished, well developed, in no acute distress  HEENT: normal  Neck: no JVD, carotid bruits, or masses Cardiac: ***RRR; no murmurs, rubs, or gallops,no edema  Respiratory:  clear to auscultation bilaterally, normal work of breathing GI: soft, nontender, nondistended, + BS MS: no deformity or atrophy  Skin: warm and dry Neuro:  Strength and sensation are intact Psych: euthymic mood, full affect  EKG:  EKG {ACTION; IS/IS [id number]}OT:[id number]} ordered today. Personal review of the ekg ordered shows ***  Recent Labs: 08/06/2018: ALT 93; B Natriuretic Peptide 211.0; TSH 1.984 08/07/2018: BUN 14; Creatinine, Ser 0.97; Hemoglobin 13.4; Platelets 182; Potassium 3.9; Sodium 137    Lipid Panel  No results found for: CHOL, TRIG, HDL, CHOLHDL, VLDL, LDLCALC, LDLDIRECT   Wt Readings from Last 3 Encounters:  08/18/18 212 lb (96.2 kg)  08/07/18 217 lb 4.8 oz (98.6 kg)  01/11/17 220 lb (99.8 kg)      Other studies Reviewed: Additional studies/ records that were reviewed today include:  TEE 08/14/18  Review of the above records today demonstrates:  - Left ventricle: The cavity size was normal. Systolic function was   normal. The estimated ejection fraction was 55%. Wall motion was   normal; there were no regional wall motion abnormalities. No   evidence of thrombus. - Aortic valve: There was no stenosis. - Aorta: Normal caliber aorta with minimal plaque. - Mitral valve: There was mild to  moderate regurgitation. - Left atrium: The atrium was mildly dilated. No evidence of   thrombus in the atrial cavity or appendage. No evidence of   thrombus in the atrial cavity or appendage. - Right ventricle: The cavity size was normal. Systolic function   was normal. - Right atrium: No evidence of thrombus in the atrial cavity or   appendage.   ASSESSMENT AND PLAN:  1.  Typical atrial flutter: Currently on Eliquis.  This patients CHA2DS2-VASc Score and unadjusted Ischemic Stroke Rate (% per year) is equal to 0.2 % stroke rate/year from a score of 0  Above score calculated as 1 point each if present [CHF, HTN, DM, Vascular=MI/PAD/Aortic Plaque, Age if 65-74, or Male] Above score calculated as 2 points each if present [Age > 75, or Stroke/TIA/TE]    Current medicines are reviewed at length with the patient today.   The patient {ACTIONS; HAS/DOES NOT HAVE:19233} concerns regarding his medicines.  The following changes were made today:  {NONE DEFAULTED:18576::"none"}  Labs/ tests ordered today include: *** No orders of the defined types were placed in this encounter.    Disposition:   FU with Will Camnitz {gen number [id number]} {Days to years:10300}  Signed, Will Jorja Loa, MD  12/19/2018 8:13 AM     [facility] [address] [place] [address] [phone] (office) [phone] (fax)

## 2018-12-29 ENCOUNTER — Encounter: Payer: Self-pay | Admitting: Cardiology

## 2018-12-29 ENCOUNTER — Ambulatory Visit: Payer: BLUE CROSS/BLUE SHIELD | Admitting: Cardiology

## 2018-12-29 VITALS — BP 144/90 | HR 68 | Ht 69.0 in | Wt 219.4 lb

## 2018-12-29 DIAGNOSIS — R079 Chest pain, unspecified: Secondary | ICD-10-CM | POA: Diagnosis not present

## 2018-12-29 DIAGNOSIS — I483 Typical atrial flutter: Secondary | ICD-10-CM | POA: Diagnosis not present

## 2018-12-29 DIAGNOSIS — Z01812 Encounter for preprocedural laboratory examination: Secondary | ICD-10-CM | POA: Diagnosis not present

## 2018-12-29 LAB — BASIC METABOLIC PANEL
BUN / CREAT RATIO: 14 (ref 9–20)
BUN: 12 mg/dL (ref 6–24)
CO2: 20 mmol/L (ref 20–29)
Calcium: 9.2 mg/dL (ref 8.7–10.2)
Chloride: 102 mmol/L (ref 96–106)
Creatinine, Ser: 0.88 mg/dL (ref 0.76–1.27)
GFR calc Af Amer: 116 mL/min/{1.73_m2} (ref >59)
GFR, EST NON AFRICAN AMERICAN: 100 mL/min/{1.73_m2} (ref >59)
Glucose: 110 mg/dL — ABNORMAL HIGH (ref 65–99)
POTASSIUM: 4.1 mmol/L (ref 3.5–5.2)
SODIUM: 138 mmol/L (ref 134–144)

## 2018-12-29 NOTE — Patient Instructions (Addendum)
Medication Instructions:  Your physician has recommended you make the following change in your medication:  1. START Eliquis 5 mg twice daily  -- you will start this medication 3 weeks prior to your ablation  * If you need a refill on your cardiac medications before your next appointment, please call your pharmacy.   Labwork: BMET today *We will only notify you of abnormal results, otherwise continue current treatment plan.  Testing/Procedures: Your physician has requested that you have cardiac CT. Cardiac computed tomography (CT) is a painless test that uses an x-ray machine to take clear, detailed pictures of your heart. For further information please visit [email or web address] Please follow instruction below under special instructions area.   Your physician has recommended that you have an ablation. Catheter ablation is a medical procedure used to treat some cardiac arrhythmias (irregular heartbeats). During catheter ablation, a long, thin, flexible tube is put into a blood vessel in your groin (upper thigh), or neck. This tube is called an ablation catheter. It is then guided to your heart through the blood vessel. Radio frequency waves destroy small areas of heart tissue where abnormal heartbeats may cause an arrhythmia to start.  The nurse will call you to schedule this procedure.  The following dates are available: 3/18, 3/19, 3/25, 3/26  Instructions for your ablation: 1. Please arrive at the [facility], Main Entrance "A", of [facility] at _____ on ______. 2. Do not eat or drink after midnight the night prior to the procedure. 3. Do not miss any doses of ELIQUIS prior to the morning of the procedure.  4. Do not take any medications the morning of the procedure. 5. Both of your groins will need to be shaved for this procedure (if needed). We ask that you do this yourself at home 1-2 days prior to the procedure.  If you are unable/uncomfortable to do yourself, the hospital  staff will shave you the day of your procedure (if needed). 6. Plan for an overnight stay in the hospital. 7. You will need someone to drive you home at discharge.   Follow-Up: Your physician recommends that you schedule a follow-up appointment in: to be determined  *Please note that any paperwork needing to be filled out by the provider will need to be addressed at the front desk prior to seeing the provider. Please note that any FMLA, disability or other documents regarding health condition is subject to a $25.00 charge that must be received prior to completion of paperwork in the form of a money order or check.  Thank you for choosing [facility]!!   Dory Horn, RN [phone]  Any Other Special Instructions Will Be Listed Below (If Applicable).  Check your blood pressure at home  Please arrive at the [facility] main entrance of [facility] at xx:xx AM (30-45 minutes prior to test start time)  [facility] [address] [place], [place] [address] [phone]  Proceed to the [facility] Radiology Department (First Floor).  Please follow these instructions carefully (unless otherwise directed):  Hold all erectile dysfunction medications at least 48 hours prior to test.  On the Night Before the Test: . Be sure to Drink plenty of water. . Do not consume any caffeinated/decaffeinated beverages or chocolate 12 hours prior to your test. . Do not take any antihistamines 12 hours prior to your test. .  On the Day of the Test: . Drink plenty of water. Do not drink any water within one hour  of the test. . Do not eat any food 4 hours prior to the test. . You may take your regular medications prior to the test.  . TAKE YOUR DILTIAZEM THE MORNING OF THIS TEST . HOLD Furosemide/Hydrochlorothiazide morning of the test.       After the Test: . Drink plenty of water. . After receiving IV contrast, you may experience a mild flushed feeling. This is  normal. . On occasion, you may experience a mild rash up to 24 hours after the test. This is not dangerous. If this occurs, you can take Benadryl 25 mg and increase your fluid intake. . If you experience trouble breathing, this can be serious. If it is severe call 911 IMMEDIATELY. If it is mild, please call our office.

## 2018-12-29 NOTE — Addendum Note (Signed)
Addended by: Tonita Phoenix on: 12/29/2018 09:58 AM   Modules accepted: Orders

## 2018-12-29 NOTE — Progress Notes (Signed)
Electrophysiology Office Note   Date:  12/29/2018   ID:  Huynh, [place]oy August 26, 1971, MRN [id number]  PCP:  Patient, No Pcp Per  Cardiologist:   Primary Electrophysiologist:  Will Jorja Loa, MD    No chief complaint on file.    History of Present Illness: Kenneth Marsh is a 48 y.o. male who is being seen today for the evaluation of atrial flutter. Presenting today for electrophysiology evaluation.  He was admitted to [facility] September 2019 with chest discomfort.  He was found to be in atrial flutter with heart rates of 140.  He was started on Eliquis and received TEE and cardioversion.    Today, he denies symptoms of  orthopnea, PND, lower extremity edema, claudication, dizziness, presyncope, syncope, bleeding, or neurologic sequela. The patient is tolerating medications without difficulties.  He is continued to have chest pain and shortness of breath.  He is also having episodic palpitations.  His chest pain is been occurring for the last 2 months.  It is worse with exertion such as when he works out with a gym and better with rest.  It is located in his chest and does not radiate.  It is also associated with some shortness of breath.  He also has occasional palpitations but he does not feel like he has been back in atrial flutter.   Past Medical History:  Diagnosis Date  . Atrial flutter [facility])    Past Surgical History:  Procedure Laterality Date  . CARDIOVERSION N/A 08/07/2018   Procedure: CARDIOVERSION;  Surgeon: Laurey Morale, MD;  Location: [facility] ENDOSCOPY;  Service: Cardiovascular;  Laterality: N/A;  . TEE WITHOUT CARDIOVERSION N/A 08/07/2018   Procedure: TRANSESOPHAGEAL ECHOCARDIOGRAM (TEE);  Surgeon: Laurey Morale, MD;  Location: [facility] ENDOSCOPY;  Service: Cardiovascular;  Laterality: N/A;     Current Outpatient Medications  Medication Sig Dispense Refill  . apixaban (ELIQUIS) 5 MG TABS tablet Take 1 tablet (5 mg total) by mouth 2 (two) times  daily. 60 tablet 3  . diltiazem (CARDIZEM CD) 180 MG 24 hr capsule Take 1 capsule (180 mg total) by mouth daily. 30 capsule 3   No current facility-administered medications for this visit.     Allergies:   Patient has no known allergies.   Social History:  The patient  reports that he has never smoked. He has never used smokeless tobacco. He reports current alcohol use. He reports that he does not use drugs.   Family History:  The patient's family history includes Hypertension in his father.    ROS:  Please see the history of present illness.   Otherwise, review of systems is positive for fatigue, chest pain, shortness of breath, palpitations, visual changes, sneezing, anxiety, headaches, dizziness, passing out.   All other systems are reviewed and negative.    PHYSICAL EXAM: VS:  BP (!) 144/90   Pulse 68   Ht 5\' 9"  (1.753 m)   Wt 219 lb 6.4 oz (99.5 kg)   SpO2 94%   BMI 32.40 kg/m  , BMI Body mass index is 32.4 kg/m. GEN: Well nourished, well developed, in no acute distress  HEENT: normal  Neck: no JVD, carotid bruits, or masses Cardiac: RRR; no murmurs, rubs, or gallops,no edema  Respiratory:  clear to auscultation bilaterally, normal work of breathing GI: soft, nontender, nondistended, + BS MS: no deformity or atrophy  Skin: warm and dry Neuro:  Strength and sensation are intact Psych: euthymic mood, full affect  EKG:  EKG is ordered today. Personal review of the ekg ordered shows this rhythm, rate 68  Recent Labs: 08/06/2018: ALT 93; B Natriuretic Peptide 211.0; TSH 1.984 08/07/2018: BUN 14; Creatinine, Ser 0.97; Hemoglobin 13.4; Platelets 182; Potassium 3.9; Sodium 137    Lipid Panel  No results found for: CHOL, TRIG, HDL, CHOLHDL, VLDL, LDLCALC, LDLDIRECT   Wt Readings from Last 3 Encounters:  12/29/18 219 lb 6.4 oz (99.5 kg)  08/18/18 212 lb (96.2 kg)  08/07/18 217 lb 4.8 oz (98.6 kg)      Other studies Reviewed: Additional studies/ records that were  reviewed today include: TEE 08/14/18  Review of the above records today demonstrates:  - Left ventricle: The cavity size was normal. Systolic function was   normal. The estimated ejection fraction was 55%. Wall motion was   normal; there were no regional wall motion abnormalities. No   evidence of thrombus. - Aortic valve: There was no stenosis. - Aorta: Normal caliber aorta with minimal plaque. - Mitral valve: There was mild to moderate regurgitation. - Left atrium: The atrium was mildly dilated. No evidence of   thrombus in the atrial cavity or appendage. No evidence of   thrombus in the atrial cavity or appendage. - Right ventricle: The cavity size was normal. Systolic function   was normal. - Right atrium: No evidence of thrombus in the atrial cavity or   appendage.   ASSESSMENT AND PLAN:  1.  Typical atrial flutter: Appears to be typical based on EKG.  We will plan to restart Eliquis 3 weeks prior to his ablation.  He would prefer to have ablation performed.  Risks and benefits were discussed include bleeding, tamponade, heart block, stroke.  He understands the risks and is agreed to the procedure.  This patients CHA2DS2-VASc Score and unadjusted Ischemic Stroke Rate (% per year) is equal to 0.2 % stroke rate/year from a score of 0  Above score calculated as 1 point each if present [CHF, HTN, DM, Vascular=MI/PAD/Aortic Plaque, Age if 65-74, or Male] Above score calculated as 2 points each if present [Age > 75, or Stroke/TIA/TE]  2.  Chest pain: Could be to angina.  Pain is worse with exertion and improved with rest.  Will order a cardiac CT for further evaluation.  Current medicines are reviewed at length with the patient today.   The patient does not have concerns regarding his medicines.  The following changes were made today: Restart Eliquis  Labs/ tests ordered today include:  Orders Placed This Encounter  Procedures  . CT CORONARY MORPH W/CTA COR W/SCORE W/CA W/CM &/OR  WO/CM  . CT CORONARY FRACTIONAL FLOW RESERVE DATA PREP  . CT CORONARY FRACTIONAL FLOW RESERVE FLUID ANALYSIS  . EKG 12-Lead     Disposition:   FU with Will Camnitz 2 months  Signed, Will Jorja Loa, MD  12/29/2018 9:28 AM     [facility] [address] [place] [address] [phone] (office) [phone] (fax)

## 2019-01-05 ENCOUNTER — Telehealth (HOSPITAL_COMMUNITY): Payer: Self-pay | Admitting: Emergency Medicine

## 2019-01-05 NOTE — Telephone Encounter (Signed)
Left message on voicemail with name and callback number Khiley Lieser RN Navigator Cardiac Imaging [facility] [phone] Office [phone] Cell  

## 2019-01-08 ENCOUNTER — Ambulatory Visit (HOSPITAL_COMMUNITY): Admission: RE | Admit: 2019-01-08 | Payer: BLUE CROSS/BLUE SHIELD | Source: Ambulatory Visit

## 2019-01-08 ENCOUNTER — Ambulatory Visit (HOSPITAL_COMMUNITY)
Admission: RE | Admit: 2019-01-08 | Discharge: 2019-01-08 | Disposition: A | Discharge status: Home / Self Care | Payer: BLUE CROSS/BLUE SHIELD | Source: Ambulatory Visit | Attending: Cardiology | Admitting: Cardiology

## 2019-01-08 ENCOUNTER — Encounter (HOSPITAL_COMMUNITY): Payer: Self-pay

## 2019-01-08 DIAGNOSIS — R079 Chest pain, unspecified: Secondary | ICD-10-CM | POA: Diagnosis present

## 2019-01-08 MED ORDER — NITROGLYCERIN 0.4 MG SL SUBL
SUBLINGUAL_TABLET | SUBLINGUAL | Status: AC
  Administered 2019-01-08: 0.8 mg via SUBLINGUAL
  Filled 2019-01-08: qty 2

## 2019-01-08 MED ORDER — METOPROLOL TARTRATE 5 MG/5ML IV SOLN
5.0000 mg | INTRAVENOUS | Status: DC | PRN
  Administered 2019-01-08 (×2): 5 mg via INTRAVENOUS
  Filled 2019-01-08: qty 5

## 2019-01-08 MED ORDER — METOPROLOL TARTRATE 5 MG/5ML IV SOLN
INTRAVENOUS | Status: AC
  Administered 2019-01-08: 5 mg via INTRAVENOUS
  Filled 2019-01-08: qty 20

## 2019-01-08 MED ORDER — NITROGLYCERIN 0.4 MG SL SUBL
0.8000 mg | SUBLINGUAL_TABLET | Freq: Once | SUBLINGUAL | Status: AC
  Administered 2019-01-08: 0.8 mg via SUBLINGUAL
  Filled 2019-01-08: qty 25

## 2019-01-08 MED ORDER — IOPAMIDOL (ISOVUE-370) INJECTION 76%
80.0000 mL | Freq: Once | INTRAVENOUS | Status: AC | PRN
  Administered 2019-01-08: 80 mL via INTRAVENOUS

## 2019-01-08 NOTE — Progress Notes (Signed)
Has some central chest pain 2-3/10. No SOB. Has this often in the mornings. He feels better with exercise. Also decreases with deep breath. A little tender with palpation. He is not yet taking his Elequis and last took his diltiazem 3 days ago.

## 2019-01-09 ENCOUNTER — Telehealth: Payer: Self-pay | Admitting: *Deleted

## 2019-01-09 NOTE — Telephone Encounter (Signed)
Pt stopped by the office this morning asking for note for being out of work the last month or so. Pt states no one put him out of work, he wasn't feeling well (weak, fatigued) so he didn't go in.  Informed pt that we did not place him out of work so we would not write a note for him to be absent (Dr. Elberta Fortis is in agreeance).  Further explained that we saw pt less than 2 weeks ago and he mentioned nothing at that appointment.  Further clarified that pt has not reached out to our office of his PCP to discuss not feeling well.  Advised to contact PCP or urgent care to discuss w/ them but that our office would not be writing an out of work note. Advised pt that he should call the office soon to schedule an ablation.   Pt verbalized that he will call us.

## 2019-01-17 ENCOUNTER — Telehealth: Payer: Self-pay | Admitting: *Deleted

## 2019-01-17 DIAGNOSIS — R931 Abnormal findings on diagnostic imaging of heart and coronary circulation: Secondary | ICD-10-CM

## 2019-01-17 NOTE — Telephone Encounter (Signed)
-----   Message from Will Jorja Loa, MD sent at 01/08/2019  9:29 PM EST ----- Needs CT chest with protocol as described below.

## 2019-01-17 NOTE — Telephone Encounter (Signed)
lmtcb

## 2019-01-25 NOTE — Telephone Encounter (Signed)
Follow up    Patient is returning call in reference to CT results. Patient is also requesting a referral to a provider to check his prostate.  Please call [phone].

## 2019-01-25 NOTE — Telephone Encounter (Signed)
Called patient back about his message. Informed patient that Dr. Elberta Fortis wants him to have a Chest CT w/wo contrast to look at some findings that were seen on his test. Will order test and have [facility] call patient for appointment. Patient stated he has been having bleeding from his rectum. Encouraged patient to go to his PCP. Patient stated he does not have a PCP, so patient was given phone number to help find a PCP. Patient verbalized understanding. Will send message to Dr. Elberta Fortis nurse Sherri to follow up.

## 2019-03-07 ENCOUNTER — Inpatient Hospital Stay: Admission: RE | Admit: 2019-03-07 | Payer: BLUE CROSS/BLUE SHIELD | Source: Ambulatory Visit

## 2019-03-08 ENCOUNTER — Telehealth: Payer: Self-pay | Admitting: *Deleted

## 2019-03-08 NOTE — Telephone Encounter (Signed)
Covid-19 travel screening questions  Have you traveled in the last 14 days? If yes where? No Do you now or have you had a fever in the last 14 days? No Do you have any respiratory symptoms of shortness of breath or cough now or in the last 14 days? no Do you have a medical history of Congestive Heart Failure? No Do you have a medical history of lung disease? No Do you have any family members or close contacts with diagnosed or suspected Covid-19? No      

## 2019-03-09 ENCOUNTER — Other Ambulatory Visit: Payer: Self-pay

## 2019-03-09 ENCOUNTER — Ambulatory Visit (INDEPENDENT_AMBULATORY_CARE_PROVIDER_SITE_OTHER)
Admission: RE | Admit: 2019-03-09 | Discharge: 2019-03-09 | Disposition: A | Discharge status: Home / Self Care | Payer: BLUE CROSS/BLUE SHIELD | Source: Ambulatory Visit | Attending: Cardiology | Admitting: Cardiology

## 2019-03-09 DIAGNOSIS — R931 Abnormal findings on diagnostic imaging of heart and coronary circulation: Secondary | ICD-10-CM | POA: Diagnosis not present

## 2019-03-09 MED ORDER — IOHEXOL 350 MG/ML SOLN
100.0000 mL | Freq: Once | INTRAVENOUS | Status: AC | PRN
  Administered 2019-03-09: 100 mL via INTRAVENOUS

## 2019-06-11 ENCOUNTER — Inpatient Hospital Stay: Admission: AD | Admit: 2019-06-11 | Payer: BLUE CROSS/BLUE SHIELD | Admitting: Internal Medicine

## 2019-07-13 ENCOUNTER — Encounter: Payer: Self-pay | Admitting: Cardiology

## 2019-07-13 ENCOUNTER — Other Ambulatory Visit: Payer: Self-pay

## 2019-07-13 ENCOUNTER — Ambulatory Visit (INDEPENDENT_AMBULATORY_CARE_PROVIDER_SITE_OTHER): Payer: BLUE CROSS/BLUE SHIELD | Admitting: Cardiology

## 2019-07-13 VITALS — BP 150/106 | HR 79 | Ht 69.0 in | Wt 218.0 lb

## 2019-07-13 DIAGNOSIS — I483 Typical atrial flutter: Secondary | ICD-10-CM

## 2019-07-13 NOTE — Progress Notes (Signed)
Electrophysiology Office Note   Date:  07/13/2019   ID:  [name], [name] [birth year], MRN [id number]  PCP:  Patient, No Pcp Per  Cardiologist:   Primary Electrophysiologist:  Antonietta Lansdowne Meredith Leeds, MD    No chief complaint on file.    History of Present Illness: Kenneth Marsh is a 48 y.o. male who is being seen today for the evaluation of atrial flutter. Presenting today for electrophysiology evaluation.  He was admitted to [facility] September 2019 with chest discomfort.  He was found to be in atrial flutter with heart rates of 140.  He was started on Eliquis and received TEE and cardioversion.    Today, denies symptoms of palpitations, chest pain, shortness of breath, orthopnea, PND, lower extremity edema, claudication, dizziness, presyncope, syncope, bleeding, or neurologic sequela. The patient is tolerating medications without difficulties.    Past Medical History:  Diagnosis Date  . Atrial flutter [facility])    Past Surgical History:  Procedure Laterality Date  . CARDIOVERSION N/A 08/07/2018   Procedure: CARDIOVERSION;  Surgeon: Larey Dresser, MD;  Location: [facility] ENDOSCOPY;  Service: Cardiovascular;  Laterality: N/A;  . TEE WITHOUT CARDIOVERSION N/A 08/07/2018   Procedure: TRANSESOPHAGEAL ECHOCARDIOGRAM (TEE);  Surgeon: Larey Dresser, MD;  Location: [facility] ENDOSCOPY;  Service: Cardiovascular;  Laterality: N/A;     No current outpatient medications on file.   No current facility-administered medications for this visit.     Allergies:   Patient has no known allergies.   Social History:  The patient  reports that he has never smoked. He has never used smokeless tobacco. He reports current alcohol use. He reports that he does not use drugs.   Family History:  The patient's family history includes Hypertension in his father.    ROS:  Please see the history of present illness.   Otherwise, review of systems is positive for none.   All other systems are  reviewed and negative.   PHYSICAL EXAM: VS:  BP (!) 150/106   Pulse 79   Ht 5\' 9"  (1.753 m)   Wt 218 lb (98.9 kg)   SpO2 97%   BMI 32.19 kg/m  , BMI Body mass index is 32.19 kg/m. GEN: Well nourished, well developed, in no acute distress  HEENT: normal  Neck: no JVD, carotid bruits, or masses Cardiac: RRR; no murmurs, rubs, or gallops,no edema  Respiratory:  clear to auscultation bilaterally, normal work of breathing GI: soft, nontender, nondistended, + BS MS: no deformity or atrophy  Skin: warm and dry Neuro:  Strength and sensation are intact Psych: euthymic mood, full affect  EKG:  EKG is ordered today. Personal review of the ekg ordered shows sinus rhythm   Recent Labs: 08/06/2018: ALT 93; B Natriuretic Peptide 211.0; TSH 1.984 08/07/2018: Hemoglobin 13.4; Platelets 182 12/29/2018: BUN 12; Creatinine, Ser 0.88; Potassium 4.1; Sodium 138    Lipid Panel  No results found for: CHOL, TRIG, HDL, CHOLHDL, VLDL, LDLCALC, LDLDIRECT   Wt Readings from Last 3 Encounters:  07/13/19 218 lb (98.9 kg)  12/29/18 219 lb 6.4 oz (99.5 kg)  08/18/18 212 lb (96.2 kg)      Other studies Reviewed: Additional studies/ records that were reviewed today include: TEE 08/14/18  Review of the above records today demonstrates:  - Left ventricle: The cavity size was normal. Systolic function was   normal. The estimated ejection fraction was 55%. Wall motion was   normal; there were no regional wall motion abnormalities. No  evidence of thrombus. - Aortic valve: There was no stenosis. - Aorta: Normal caliber aorta with minimal plaque. - Mitral valve: There was mild to moderate regurgitation. - Left atrium: The atrium was mildly dilated. No evidence of   thrombus in the atrial cavity or appendage. No evidence of   thrombus in the atrial cavity or appendage. - Right ventricle: The cavity size was normal. Systolic function   was normal. - Right atrium: No evidence of thrombus in the atrial  cavity or   appendage.   ASSESSMENT AND PLAN:  1.  Typical atrial flutter: Appears to be typical based on ECG..  He is currently having no chest pain or shortness of breath.  He is noted no symptoms of palpitations.  Due to that, we Savaya Hakes hold off on ablation for now.  We Vielka Klinedinst continue without anticoagulation.  This patients CHA2DS2-VASc Score and unadjusted Ischemic Stroke Rate (% per year) is equal to 0.2 % stroke rate/year from a score of 0  Above score calculated as 1 point each if present [CHF, HTN, DM, Vascular=MI/PAD/Aortic Plaque, Age if 65-74, or Male] Above score calculated as 2 points each if present [Age > 75, or Stroke/TIA/TE]   Current medicines are reviewed at length with the patient today.   The patient does not have concerns regarding his medicines.  The following changes were made today: none  Labs/ tests ordered today include:  Orders Placed This Encounter  Procedures  . EKG 12-Lead     Disposition:   FU with Areeb Corron 6 months  Signed, Mickell Birdwell Jorja LoaMartin Lillyanne Bradburn, MD  07/13/2019 9:26 AM     [facility] [address] [place] [address] [phone] (office) [phone] (fax)

## 2021-04-02 ENCOUNTER — Other Ambulatory Visit: Payer: Self-pay

## 2021-04-02 ENCOUNTER — Encounter: Payer: Self-pay | Admitting: Cardiology

## 2021-04-02 ENCOUNTER — Ambulatory Visit: Payer: 59 | Admitting: Cardiology

## 2021-04-02 VITALS — BP 150/100 | HR 78 | Ht 68.0 in | Wt 222.6 lb

## 2021-04-02 DIAGNOSIS — I483 Typical atrial flutter: Secondary | ICD-10-CM | POA: Diagnosis not present

## 2021-04-02 NOTE — Patient Instructions (Signed)
Medication Instructions:  Your physician recommends that you continue on your current medications as directed. Please refer to the Current Medication list given to you today.  *If you need a refill on your cardiac medications before your next appointment, please call your pharmacy*   Lab Work: none If you have labs (blood work) drawn today and your tests are completely normal, you will receive your results only by: Marland Kitchen MyChart Message (if you have MyChart) OR . A paper copy in the mail If you have any lab test that is abnormal or we need to change your treatment, we will call you to review the results.   Testing/Procedures: none   Follow-Up: At [facility], you and your health needs are our priority.  As part of our continuing mission to provide you with exceptional heart care, we have created designated Provider Care Teams.  These Care Teams include your primary Cardiologist (physician) and Advanced Practice Providers (APPs -  Physician Assistants and Nurse Practitioners) who all work together to provide you with the care you need, when you need it.  We recommend signing up for the patient portal called "MyChart".  Sign up information is provided on this After Visit Summary.  MyChart is used to connect with patients for Virtual Visits (Telemedicine).  Patients are able to view lab/test results, encounter notes, upcoming appointments, etc.  Non-urgent messages can be sent to your provider as well.   To learn more about what you can do with MyChart, go to [email or web address].    Your next appointment:   1 year(s)  The format for your next appointment:   In Person  Provider:   You will see one of the following Advanced Practice Providers on your designated Care Team:    Gypsy Balsam, NP  Francis Dowse, PA-C  Casimiro Needle "Otilio Saber, [place]   Other Instructions

## 2021-04-02 NOTE — Progress Notes (Signed)
Electrophysiology Office Note   Date:  04/02/2021   ID:  Kenneth Marsh, Kenneth Marsh 1971/07/03, MRN [id number]  PCP:  Patient, No Pcp Per (Inactive)  Cardiologist:   Primary Electrophysiologist:  Rayhaan Huster Jorja Loa, MD    No chief complaint on file.    History of Present Illness: Kenneth Marsh is a 50 y.o. male who is being seen today for the evaluation of atrial flutter. Presenting today for electrophysiology evaluation.    He was admitted to [facility] September 2019 with chest discomfort.  He was found to be in atrial flutter with heart rates in the 140s.  He was started on Eliquis and received TEE and cardioversion at that time.  Today, denies symptoms of palpitations, chest pain, shortness of breath, orthopnea, PND, lower extremity edema, claudication, dizziness, presyncope, syncope, bleeding, or neurologic sequela. The patient is tolerating medications without difficulties.  Since last being seen he has done well.  He is able do all of his daily activities.  He is noted no further episodes of palpitations.  He does not feel that he has been back in atrial flutter.  His blood pressure is mildly elevated today.  He feels that it is potentially due to drinking too much caffeine.  He Kenneth Marsh continue to check it at home.  He has had some back pain.  Palpation of his back reproduces the pain.  He understands this is likely musculoskeletal.  Aside from that he has no complaints.  Past Medical History:  Diagnosis Date  . Atrial flutter [facility])    Past Surgical History:  Procedure Laterality Date  . CARDIOVERSION N/A 08/07/2018   Procedure: CARDIOVERSION;  Surgeon: Laurey Morale, MD;  Location: [facility] ENDOSCOPY;  Service: Cardiovascular;  Laterality: N/A;  . TEE WITHOUT CARDIOVERSION N/A 08/07/2018   Procedure: TRANSESOPHAGEAL ECHOCARDIOGRAM (TEE);  Surgeon: Laurey Morale, MD;  Location: [facility] ENDOSCOPY;  Service: Cardiovascular;  Laterality: N/A;     No current outpatient  medications on file.   No current facility-administered medications for this visit.    Allergies:   Patient has no known allergies.   Social History:  The patient  reports that he has never smoked. He has never used smokeless tobacco. He reports current alcohol use. He reports that he does not use drugs.   Family History:  The patient's family history includes Hypertension in his father.   ROS:  Please see the history of present illness.   Otherwise, review of systems is positive for none.   All other systems are reviewed and negative.   PHYSICAL EXAM: VS:  BP (!) 150/100   Pulse 78   Ht 5\' 8"  (1.727 m)   Wt 222 lb 9.6 oz (101 kg)   SpO2 94%   BMI 33.85 kg/m  , BMI Body mass index is 33.85 kg/m. GEN: Well nourished, well developed, in no acute distress  HEENT: normal  Neck: no JVD, carotid bruits, or masses Cardiac: RRR; no murmurs, rubs, or gallops,no edema  Respiratory:  clear to auscultation bilaterally, normal work of breathing GI: soft, nontender, nondistended, + BS MS: no deformity or atrophy  Skin: warm and dry Neuro:  Strength and sensation are intact Psych: euthymic mood, full affect  EKG:  EKG is ordered today. Personal review of the ekg ordered shows sinus rhythm, rate 78  Recent Labs: No results found for requested labs within last 8760 hours.    Lipid Panel  No results found for: CHOL, TRIG, HDL, CHOLHDL, VLDL, LDLCALC, LDLDIRECT  Wt Readings from Last 3 Encounters:  04/02/21 222 lb 9.6 oz (101 kg)  07/13/19 218 lb (98.9 kg)  12/29/18 219 lb 6.4 oz (99.5 kg)      Other studies Reviewed: Additional studies/ records that were reviewed today include: TEE 08/14/18  Review of the above records today demonstrates:  - Left ventricle: The cavity size was normal. Systolic function was   normal. The estimated ejection fraction was 55%. Wall motion was   normal; there were no regional wall motion abnormalities. No   evidence of thrombus. - Aortic valve:  There was no stenosis. - Aorta: Normal caliber aorta with minimal plaque. - Mitral valve: There was mild to moderate regurgitation. - Left atrium: The atrium was mildly dilated. No evidence of   thrombus in the atrial cavity or appendage. No evidence of   thrombus in the atrial cavity or appendage. - Right ventricle: The cavity size was normal. Systolic function   was normal. - Right atrium: No evidence of thrombus in the atrial cavity or   appendage.   ASSESSMENT AND PLAN:  1.  Typical atrial flutter: CHA2DS2-VASc of 0 and thus not anticoagulated.  He has no symptoms of palpitations, chest pain, or shortness of breath.  He is fortunately had no further episodes of atrial flutter.  He continues to wish to avoid ablation.  If he does go back into atrial flutter, he would be more amenable.   Current medicines are reviewed at length with the patient today.   The patient does not have concerns regarding his medicines.  The following changes were made today: None  Labs/ tests ordered today include:  Orders Placed This Encounter  Procedures  . EKG 12-Lead     Disposition:   FU 12 months  Signed, Cesily Cuoco Jorja Loa, MD  04/02/2021 10:04 AM     [facility] [address] [place] [address] [phone] (office) [phone] (fax)

## 2021-08-14 NOTE — Progress Notes (Signed)
Subjective:    Kenneth Marsh - 50 y.o. male MRN [id number]  Date of birth: [birth year]  HPI  Kenneth Marsh is to establish care.  Current issues and/or concerns: Reports shortness of breath for at least 12 months and worsening. Endorses productive cough with phlegm, no blood production. He does not smoke. Reports intermittent chest pain. Seen by Cardiology 3 years ago and told everything was normal without follow-up. Shortness of breath only occurring during nighttime.     ROS per HPI    Health Maintenance: Health Maintenance Due  Topic Date Due   Hepatitis C Screening  Never done   COLONOSCOPY (Pts 45-11yrs Insurance coverage will need to be confirmed)  Never done   Zoster Vaccines- Shingrix (1 of 2) Never done   COVID-19 Vaccine (3 - Booster for Moderna series) 09/03/2020   INFLUENZA VACCINE  Never done     Past Medical History: Patient Active Problem List   Diagnosis Date Noted   Atrial flutter (HCC) 08/06/2018    Social History   reports that he has never smoked. He has never used smokeless tobacco. He reports that he does not currently use alcohol. He reports that he does not use drugs.   Family History  family history includes Hypertension in his father.   Medications: reviewed and updated   Objective:   Physical Exam BP (!) 150/92 (BP Location: Left Arm, Patient Position: Sitting, Cuff Size: Large)   Pulse 82   Temp 98.7 F (37.1 C)   Resp 18   Ht 5' 7.99" (1.727 m)   Wt 217 lb (98.4 kg)   SpO2 92%   BMI 33.00 kg/m   Physical Exam HENT:     Head: Normocephalic and atraumatic.  Eyes:     Extraocular Movements: Extraocular movements intact.     Conjunctiva/sclera: Conjunctivae normal.     Pupils: Pupils are equal, round, and reactive to light.  Cardiovascular:     Rate and Rhythm: Normal rate and regular rhythm.     Pulses: Normal pulses.     Heart sounds: Normal heart sounds.  Pulmonary:     Effort: Pulmonary effort is normal.      Breath sounds: Normal breath sounds.  Musculoskeletal:     Cervical back: Normal range of motion and neck supple.  Neurological:     General: No focal deficit present.     Mental Status: He is alert and oriented to person, place, and time.  Psychiatric:        Mood and Affect: Mood normal.        Behavior: Behavior normal.     Assessment & Plan:  1. Encounter to establish care: - Patient presents today to establish care.  - Return for annual physical examination, labs, and health maintenance. Arrive fasting meaning having no food for at least 8 hours prior to appointment. You may have only water or black coffee. Please take scheduled medications as normal.  2. Chronic shortness of breath: - Ongoing for at least 12 months.  - Begin Albuterol inhaler as prescribed.  - Diagnostic chest x-ray for further evaluation.  - Referral to Pulmonology for further evaluation and management.  - Follow-up with primary provider as scheduled. - DG Chest 2 View; Future - albuterol (VENTOLIN HFA) 108 (90 Base) MCG/ACT inhaler; Inhale 2 puffs into the lungs every 6 (six) hours as needed for wheezing or shortness of breath.  Dispense: 16 g; Refill: 1 - Ambulatory referral to Pulmonology  3. Elevated blood pressure reading  in office without diagnosis of hypertension: - Blood pressure not at goal during today's visit. Patient asymptomatic without chest pressure, chest pain, palpitations, shortness of breath, worst headache of life, and any additional red flag symptoms. - Counseled on blood pressure goal of less than 120/80, low-sodium, DASH diet, and 150 minutes of moderate intensity exercise per week as tolerated.  - Counseled to check blood pressure at home at least 4 days weekly, write down results with the date, and bring to blood pressure checkup in 2 weeks.   4. Need for immunization against influenza: - Administered today in office.  - Flu Vaccine QUAD 25mo+IM (Fluarix, Fluzone & Alfiuria Quad  PF)    Patient was given clear instructions to go to Emergency Department or return to medical center if symptoms don't improve, worsen, or new problems develop.The patient verbalized understanding.  I discussed the assessment and treatment plan with the patient. The patient was provided an opportunity to ask questions and all were answered. The patient agreed with the plan and demonstrated an understanding of the instructions.   The patient was advised to call back or seek an in-person evaluation if the symptoms worsen or if the condition fails to improve as anticipated.    Ricky Stabs, NP 08/18/2021, 2:39 PM Primary Care at [facility]

## 2021-08-18 ENCOUNTER — Encounter: Payer: Self-pay | Admitting: Family

## 2021-08-18 ENCOUNTER — Other Ambulatory Visit: Payer: Self-pay

## 2021-08-18 ENCOUNTER — Ambulatory Visit (INDEPENDENT_AMBULATORY_CARE_PROVIDER_SITE_OTHER): Payer: 59

## 2021-08-18 ENCOUNTER — Ambulatory Visit (INDEPENDENT_AMBULATORY_CARE_PROVIDER_SITE_OTHER): Payer: 59 | Admitting: Family

## 2021-08-18 VITALS — BP 150/92 | HR 82 | Temp 98.7°F | Resp 18 | Ht 67.99 in | Wt 217.0 lb

## 2021-08-18 DIAGNOSIS — Z23 Encounter for immunization: Secondary | ICD-10-CM

## 2021-08-18 DIAGNOSIS — Z7689 Persons encountering health services in other specified circumstances: Secondary | ICD-10-CM | POA: Diagnosis not present

## 2021-08-18 DIAGNOSIS — R030 Elevated blood-pressure reading, without diagnosis of hypertension: Secondary | ICD-10-CM

## 2021-08-18 DIAGNOSIS — R0602 Shortness of breath: Secondary | ICD-10-CM

## 2021-08-18 DIAGNOSIS — Z789 Other specified health status: Secondary | ICD-10-CM

## 2021-08-18 MED ORDER — ALBUTEROL SULFATE HFA 108 (90 BASE) MCG/ACT IN AERS: 2.0000 | INHALATION_SPRAY | Freq: Four times a day (QID) | RESPIRATORY_TRACT | 1 refills | Status: DC | PRN

## 2021-08-18 NOTE — Progress Notes (Signed)
Pt presents to establish care, pt reports experiencing wheezing and SOB at bedtime

## 2021-08-18 NOTE — Patient Instructions (Signed)
Influenza Virus Vaccine injection (Fluarix) Qu es este medicamento? La VACUNA ANTIGRIPAL ayuda a disminuir el riesgo de contraer la influenza, tambin conocida como la gripe. La vacuna solo ayuda a protegerle contra algunas cepas de influenza. Esta vacuna no ayuda a reducir el riesgo de contraer influenza pandmica H1N1. Este medicamento puede ser utilizado para otros usos; si tiene alguna pregunta consulte con su proveedor de atencin mdica o con su farmacutico. MARCAS COMUNES: Fluarix, Fluzone Qu le debo informar a mi profesional de la salud antes de tomar este medicamento? Necesita saber si usted presenta alguno de los siguientes problemas o situaciones: trastorno de sangrado como hemofilia fiebre o infeccin sndrome de Guillain-Barre u otros problemas neurolgicos problemas del sistema inmunolgico infeccin por el virus de la inmunodeficiencia humana (VIH) o SIDA niveles bajos de plaquetas en la sangre esclerosis mltiple una reaccin alrgica o inusual a las vacunas antigripales, a los huevos, protenas de pollo, al ltex, a la gentamicina, a otros medicamentos, alimentos, colorantes o conservantes si est embarazada o buscando quedar embarazada si est amamantando a un beb Cmo debo utilizar este medicamento? Esta vacuna se administra mediante inyeccin por va intramuscular. Lo administra un profesional de la salud. Recibir una copia de informacin escrita sobre la vacuna antes de cada vacuna. Asegrese de leer este folleto cada vez cuidadosamente. Este folleto puede cambiar con frecuencia. Hable con su pediatra para informarse acerca del uso de este medicamento en nios. Puede requerir atencin especial. Sobredosis: Pngase en contacto inmediatamente con un centro toxicolgico o una sala de urgencia si usted cree que haya tomado demasiado medicamento. ATENCIN: Este medicamento es solo para usted. No comparta este medicamento con nadie. Qu sucede si me olvido de una  dosis? No se aplica en este caso. Qu puede interactuar con este medicamento? quimioterapia o radioterapia medicamentos que suprimen el sistema inmunolgico, tales como etanercept, anakinra, infliximab y adalimumab medicamentos que tratan o previenen cogulos sanguneos, como warfarina fenitona medicamentos esteroideos, como la prednisona o la cortisona teofilina vacunas Puede ser que esta lista no menciona todas las posibles interacciones. Informe a su profesional de la salud de todos los productos a base de hierbas, medicamentos de venta libre o suplementos nutritivos que est tomando. Si usted fuma, consume bebidas alcohlicas o si utiliza drogas ilegales, indqueselo tambin a su profesional de la salud. Algunas sustancias pueden interactuar con su medicamento. A qu debo estar atento al usar este medicamento? Informe a su mdico o a su profesional de la salud sobre todos los efectos secundarios que persistan despus de 3 das. Llame a su proveedor de atencin mdica si se presentan sntomas inusuales dentro de las 6 semanas posteriores a la vacunacin. Es posible que todava pueda contraer la gripe, pero la enfermedad no ser tan fuerte como normalmente. No puede contraer la gripe de esta vacuna. La vacuna antigripal no le protege contra resfros u otras enfermedades que pueden causar fiebre. Debe vacunarse cada ao. Qu efectos secundarios puedo tener al utilizar este medicamento? Efectos secundarios que debe informar a su mdico o a su profesional de la salud tan pronto como sea posible: reacciones alrgicas como erupcin cutnea, picazn o urticarias, hinchazn de la cara, labios o lengua Efectos secundarios que, por lo general, no requieren atencin mdica (debe informarlos a su mdico o a su profesional de la salud si persisten o si son molestos): fiebre dolor de cabeza molestias y dolores musculares dolor, sensibilidad, enrojecimiento o hinchazn en el lugar de la  inyeccin cansancio o debilidad Puede ser que esta lista no   menciona todos los posibles efectos secundarios. Comunquese a su mdico por asesoramiento mdico [employer]. Usted puede informar los efectos secundarios a la FDA por telfono al 1-800-FDA-1088. Dnde debo guardar mi medicina? Esta vacuna se administra solamente en clnicas, farmacias, consultorio mdico u otro consultorio de un profesional de la salud y no Teacher, early years/pre en su domicilio. ATENCIN: Este folleto es un resumen. Puede ser que no cubra toda la posible informacin. Si usted tiene preguntas acerca de esta medicina, consulte con su mdico, su farmacutico o su profesional de Radiographer, therapeutic.  2022 Elsevier/Gold Standard (2010-05-05 15:31:40)

## 2021-08-20 ENCOUNTER — Other Ambulatory Visit: Payer: Self-pay | Admitting: Family

## 2021-08-20 ENCOUNTER — Telehealth (INDEPENDENT_AMBULATORY_CARE_PROVIDER_SITE_OTHER): Payer: Self-pay

## 2021-08-20 DIAGNOSIS — J849 Interstitial pulmonary disease, unspecified: Secondary | ICD-10-CM

## 2021-08-20 DIAGNOSIS — I771 Stricture of artery: Secondary | ICD-10-CM

## 2021-08-20 NOTE — Telephone Encounter (Signed)
Contacted patient with pacific interpreter [phone]) he verified date of birth. He is aware of referrals being placed. He has already been contacted by offices to schedule appointments. Maryjean Morn, CMA

## 2021-08-20 NOTE — Progress Notes (Signed)
Please call patient with update.   Referral to Cardiology for concerns with thoracic aorta. Their office should call patient within 2 weeks with appointment details.   Referral to Pulmonology for concerns of chronic lung issues. Their office should call patient within 2 weeks with appointment details.

## 2021-08-20 NOTE — Telephone Encounter (Signed)
-----   Message from Rema Fendt, NP sent at 08/20/2021  7:40 AM EDT ----- Please call patient with update.   Referral to Cardiology for concerns with thoracic aorta. Their office should call patient within 2 weeks with appointment details.   Referral to Pulmonology for concerns of chronic lung issues. Their office should call patient within 2 weeks with appointment details.

## 2021-08-20 NOTE — Progress Notes (Signed)
Opened in error

## 2021-09-23 ENCOUNTER — Encounter: Payer: Self-pay | Admitting: Internal Medicine

## 2021-09-23 ENCOUNTER — Other Ambulatory Visit: Payer: Self-pay

## 2021-09-23 ENCOUNTER — Ambulatory Visit (INDEPENDENT_AMBULATORY_CARE_PROVIDER_SITE_OTHER): Payer: 59 | Admitting: Internal Medicine

## 2021-09-23 VITALS — BP 140/90 | HR 72 | Temp 98.1°F | Ht 68.0 in | Wt 220.0 lb

## 2021-09-23 DIAGNOSIS — Z87898 Personal history of other specified conditions: Secondary | ICD-10-CM

## 2021-09-23 DIAGNOSIS — R0600 Dyspnea, unspecified: Secondary | ICD-10-CM | POA: Diagnosis not present

## 2021-09-23 DIAGNOSIS — R9389 Abnormal findings on diagnostic imaging of other specified body structures: Secondary | ICD-10-CM

## 2021-09-23 DIAGNOSIS — R053 Chronic cough: Secondary | ICD-10-CM | POA: Diagnosis not present

## 2021-09-23 LAB — CBC WITH DIFFERENTIAL/PLATELET
Basophils Absolute: 0 10*3/uL (ref 0.0–0.1)
Basophils Relative: 0.6 % (ref 0.0–3.0)
Eosinophils Absolute: 0.2 10*3/uL (ref 0.0–0.7)
Eosinophils Relative: 2.9 % (ref 0.0–5.0)
HCT: 44.7 % (ref 39.0–52.0)
Hemoglobin: 15.3 g/dL (ref 13.0–17.0)
Lymphocytes Relative: 42.2 % (ref 12.0–46.0)
Lymphs Abs: 3 10*3/uL (ref 0.7–4.0)
MCHC: 34.2 g/dL (ref 30.0–36.0)
MCV: 86.8 fl (ref 78.0–100.0)
Monocytes Absolute: 0.6 10*3/uL (ref 0.1–1.0)
Monocytes Relative: 8.2 % (ref 3.0–12.0)
Neutro Abs: 3.3 10*3/uL (ref 1.4–7.7)
Neutrophils Relative %: 46.1 % (ref 43.0–77.0)
Platelets: 179 10*3/uL (ref 150.0–400.0)
RBC: 5.14 Mil/uL (ref 4.22–5.81)
RDW: 13.5 % (ref 11.5–15.5)
WBC: 7.1 10*3/uL (ref 4.0–10.5)

## 2021-09-23 LAB — BRAIN NATRIURETIC PEPTIDE: Pro B Natriuretic peptide (BNP): 22 pg/mL (ref 0.0–100.0)

## 2021-09-23 LAB — BASIC METABOLIC PANEL
BUN: 15 mg/dL (ref 6–23)
CO2: 26 mEq/L (ref 19–32)
Calcium: 9.5 mg/dL (ref 8.4–10.5)
Chloride: 104 mEq/L (ref 96–112)
Creatinine, Ser: 0.85 mg/dL (ref 0.40–1.50)
GFR: 99.68 mL/min (ref >60.00)
Glucose, Bld: 104 mg/dL — ABNORMAL HIGH (ref 70–99)
Potassium: 3.8 mEq/L (ref 3.5–5.1)
Sodium: 135 mEq/L (ref 135–145)

## 2021-09-23 NOTE — Progress Notes (Signed)
OV 09/23/2021  Subjective:  Patient ID: Kenneth Marsh, male , DOB: 12-May-1971 , age 50 y.o. , MRN: [id number] , ADDRESS: [address] [place] [address] PCP Rema Fendt, NP Patient Care Team: Rema Fendt, NP as PCP - General (Nurse Practitioner) Regan Lemming, MD as PCP - Electrophysiology (Cardiology)  This Provider for this visit: Treatment Team:  Attending Provider: Kalman Shan, MD    09/23/2021 -   Chief Complaint  Patient presents with   Consult    Pt states after a recent cxr a spot was seen. Pt does have some occ SOB at night when he goes to sleep. States that he does that he does wheeze at times when he sleeps. Has an occ cough at night.     HPI Kenneth Marsh 50 y.o. -is a Pharmacist, hospital.  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  He is not a smoker.  In 2019 he got admitted for atrial flutter that is presumably due to caffeine intake.  He is not on any anticoagulation.  He is cardioverted.  He follows with cardiology and is believed to be in sinus.  I reviewed the notes.  He says that he has intermittent chronic wheezing since 2014 particularly at night when he lies on his side.  It is slowly getting worse particularly in the year 2022.  Also the year 2022 he said insidious onset of cough particularly at night when he lies on his side and also shortness of breath at the same time.  The shortness of breath is definitely not exertional.  There is no paroxysmal nocturnal dyspnea.  He climbs stairs and goes of the roof he does not feel short of breath.  He believes he does snore and there might be excessive daytime somnolence.  There is history of possible hypertension recently during his annual physical he did have chest x-ray this reported as ILD changes.  I personally visualized this am not sure.  Previous eosinophils slightly high.  He again categorically tells me that he does not have exertional edema cough.   It is all positional and at night.     Sept 2019  Results for [name], [name] (MRN [id number]) as of 09/23/2021 11:25  Ref. Range 08/06/2018 06:50  Eosinophils Absolute Latest Ref Range: 0.0 - 0.7 K/uL 0.2    Results for [name], [name] (MRN [id number]) as of 09/23/2021 11:25  Ref. Range 08/06/2018 06:50 08/06/2018 08:34  Alcohol, Ethyl (B) Latest Ref Range: <10 mg/dL <93   Amphetamines Latest Ref Range: NONE DETECTED   NONE DETECTED  Barbiturates Latest Ref Range: NONE DETECTED   NONE DETECTED  Benzodiazepines Latest Ref Range: NONE DETECTED   NONE DETECTED  Opiates Latest Ref Range: NONE DETECTED   NONE DETECTED  COCAINE Latest Ref Range: NONE DETECTED   NONE DETECTED  Tetrahydrocannabinol Latest Ref Range: NONE DETECTED   NONE DETECTED    ECHO  2019 Study Conclusions   - Left ventricle: The cavity size was normal. Systolic function was    normal. The estimated ejection fraction was 55%. Wall motion was    normal; there were no regional wall motion abnormalities. No    evidence of thrombus.  - Aortic valve: There was no stenosis.  - Aorta: Normal caliber aorta with minimal plaque.  - Mitral valve: There was mild to moderate regurgitation.  - Left atrium: The atrium was mildly dilated. No evidence of  thrombus in the atrial cavity or appendage. No evidence of    thrombus in the atrial cavity or appendage.  - Right ventricle: The cavity size was normal. Systolic function    was normal.  - Right atrium: No evidence of thrombus in the atrial cavity or    appendage.   Impressions:   - Successful cardioversion. No cardiac source of emboli was    indentified.   -------------------------------------------------------------------   has a past medical history of Atrial flutter (HCC).   reports that he has never smoked. He has never used smokeless tobacco.  Past Surgical History:  Procedure Laterality Date   CARDIOVERSION N/A 08/07/2018   Procedure: CARDIOVERSION;   Surgeon: Laurey Morale, MD;  Location: [facility] ENDOSCOPY;  Service: Cardiovascular;  Laterality: N/A;   TEE WITHOUT CARDIOVERSION N/A 08/07/2018   Procedure: TRANSESOPHAGEAL ECHOCARDIOGRAM (TEE);  Surgeon: Laurey Morale, MD;  Location: [facility] ENDOSCOPY;  Service: Cardiovascular;  Laterality: N/A;    No Known Allergies  Immunization History  Administered Date(s) Administered   Influenza,inj,Quad PF,6+ Mos 08/18/2021   Moderna Sars-Covid-2 Vaccination 03/06/2020, 04/03/2020   Tdap 01/11/2017    Family History  Problem Relation Age of Onset   Hypertension Father      Current Outpatient Medications:    albuterol (VENTOLIN HFA) 108 (90 Base) MCG/ACT inhaler, Inhale 2 puffs into the lungs every 6 (six) hours as needed for wheezing or shortness of breath., Disp: 16 g, Rfl: 1      Objective:   Vitals:   09/23/21 1110  BP: 140/90  Pulse: 72  Temp: 98.1 F (36.7 C)  TempSrc: Oral  SpO2: 98%  Weight: 220 lb (99.8 kg)  Height: 5\' 8"  (1.727 m)    Estimated body mass index is 33.45 kg/m as calculated from the following:   Height as of this encounter: 5\' 8"  (1.727 m).   Weight as of this encounter: 220 lb (99.8 kg).  @WEIGHTCHANGE @    09/23/21 1110  Weight: 220 lb (99.8 kg)     Physical Exam  General Appearance:    Alert, cooperative, no distress, appears stated age - yes , Deconditioned looking - no , OBESE  - no, Sitting on Wheelchair -  no  Head:    Normocephalic, without obvious abnormality, atraumatic  Eyes:    PERRL, conjunctiva/corneas clear,  Ears:    Normal TM's and external ear canals, both ears  Nose:   Nares normal, septum midline, mucosa normal, no drainage    or sinus tenderness. OXYGEN ON  - no . Patient is @ ra   Throat:   Lips, mucosa, and tongue normal; teeth and gums normal. Cyanosis on lips - no  Neck:   Supple, symmetrical, trachea midline, no adenopathy;    thyroid:  no enlargement/tenderness/nodules; no carotid   bruit or JVD  Back:      Symmetric, no curvature, ROM normal, no CVA tenderness  Lungs:     Distress - no , Wheeze no, Barrell Chest - no, Purse lip breathing - no, Crackles - no   Chest Wall:    No tenderness or deformity.    Heart:    Regular rate and rhythm, S1 and S2 normal, no rub   or gallop, Murmur - no  Breast Exam:    NOT DONE  Abdomen:     Soft, non-tender, bowel sounds active all four quadrants,    no masses, no organomegaly. Visceral obesity - yes  Genitalia:   NOT DONE  Rectal:  NOT DONE  Extremities:   Extremities - normal, Has Cane - no, Clubbing - no, Edema - no  Pulses:   2+ and symmetric all extremities  Skin:   Stigmata of Connective Tissue Disease - no  Lymph nodes:   Cervical, supraclavicular, and axillary nodes normal  Psychiatric:  Neurologic:   Pleasant - yes, Anxious - no, Flat affect - no  CAm-ICU - neg, Alert and Oriented x 3 - yes, Moves all 4s - yes, Speech - normal, Cognition - intact        Assessment:       ICD-10-CM   1. Dyspnea, unspecified type  R06.00 CBC with Differential/Platelet    Resp Allergy Profile Regn2DC DE MD [place] [facility]    IgE    Brain natriuretic peptide    Basic metabolic panel    CT Chest High Resolution    Pulse oximetry, overnight    Pulmonary function test    Nitric oxide    2. Chronic cough  R05.3 CBC with Differential/Platelet    Resp Allergy Profile Regn2DC DE MD [place] [facility]    IgE    Brain natriuretic peptide    Basic metabolic panel    CT Chest High Resolution    Pulse oximetry, overnight    Pulmonary function test    Nitric oxide    3. H/O wheezing  Z87.898 CBC with Differential/Platelet    Resp Allergy Profile Regn2DC DE MD [place] [facility]    IgE    Brain natriuretic peptide    Basic metabolic panel    CT Chest High Resolution    Pulse oximetry, overnight    Pulmonary function test    Nitric oxide    4. Abnormal chest x-ray  R93.89 CBC with Differential/Platelet    Resp Allergy Profile Regn2DC DE MD [place] [facility]    IgE    Brain natriuretic peptide     Basic metabolic panel    CT Chest High Resolution    Pulse oximetry, overnight    Pulmonary function test    Nitric oxide    5. History of snoring  Z87.898 Pulse oximetry, overnight         Plan:     Patient Instructions     ICD-10-CM   1. Dyspnea, unspecified type  R06.00     2. Chronic cough  R05.3     3. H/O wheezing  Z87.898     4. Abnormal chest x-ray  R93.89     5. History of snoring  Z87.898       Symptoms sound like asthma or sleep apnea but cxr does not match with these findings . CXR Is suggesting pulmonary tissue inflammation  Plan  - get HRCT supine/prone  - get ONO on room air at rest - get full PFT and FeNO test - get cbc with diff, bnp, bmet, IgE and RAST allergy profile  Followup = NP or MD Dr Marchelle Gearing for followup In few weeks but after completing above  - if CT chest shows a condition called ILD you might need additional blood work     SIGNATURE    Dr. Kalman Shan, M.D., F.C.C.P,  Pulmonary and Critical Care Medicine Staff Physician, [facility] Center Director - Interstitial Lung Disease  Program  Pulmonary Fibrosis [facility] [place], [place], [address]  Pager: [phone], If no answer or between  15:00h - 7:00h: call [phone] Telephone: [phone]  11:48 AM  09/23/2021  

## 2021-09-23 NOTE — Patient Instructions (Addendum)
ICD-10-CM   1. Dyspnea, unspecified type  R06.00     2. Chronic cough  R05.3     3. H/O wheezing  Z87.898     4. Abnormal chest x-ray  R93.89     5. History of snoring  Z87.898       Symptoms sound like asthma or sleep apnea but cxr does not match with these findings . CXR Is suggesting pulmonary tissue inflammation  Plan  - get HRCT supine/prone  - get ONO on room air at rest - get full PFT and FeNO test - get cbc with diff, bnp, bmet, IgE and RAST allergy profile  Followup = NP or MD Dr Marchelle Gearing for followup In few weeks but after completing above  - if CT chest shows a condition called ILD you might need additional blood work

## 2021-09-23 NOTE — Addendum Note (Signed)
Addended by: Demetrio Lapping E on: 09/23/2021 11:51 AM   Modules accepted: Orders

## 2021-09-24 LAB — RESPIRATORY ALLERGY PROFILE REGION II ~~LOC~~

## 2021-09-24 LAB — INTERPRETATION:

## 2021-09-25 NOTE — Progress Notes (Signed)
Patient ID: Kenneth Marsh, male    DOB: [birth year]  MRN: [id number]  CC: Annual Physical Exam   Subjective: Kenneth Marsh is a 50 y.o. male who presents for annual physical exam.   His concerns today include:   BLOOD PRESSURE CHECK: Adherence with salt restriction (low-salt diet): []  Yes  [x]  No Exercise: Yes []  No [x]  Home Monitoring?: []  Yes    [x]  No Home BP results range: []  Yes    [x]  No SOB? Sometimes, history of atrial flutter, followed by Cardiology and states they never do anything Chest Pain?: sometimes, history of atrial flutter, followed by Cardiology and states they never do anything  Patient Active Problem List   Diagnosis Date Noted   Primary hypertension 09/29/2021   Atrial flutter (HCC) 08/06/2018     Current Outpatient Medications on File Prior to Visit  Medication Sig Dispense Refill   albuterol (VENTOLIN HFA) 108 (90 Base) MCG/ACT inhaler Inhale 2 puffs into the lungs every 6 (six) hours as needed for wheezing or shortness of breath. 16 g 1   No current facility-administered medications on file prior to visit.    No Known Allergies  Social History   Socioeconomic History   Marital status: Divorced    Spouse name: Not on file   Number of children: Not on file   Years of education: Not on file   Highest education level: Not on file  Occupational History   Not on file  Tobacco Use   Smoking status: Never   Smokeless tobacco: Never  Substance and Sexual Activity   Alcohol use: Not Currently   Drug use: No   Sexual activity: Yes  Other Topics Concern   Not on file  Social History Narrative   Not on file   Social Determinants of Health   Financial Resource Strain: Not on file  Food Insecurity: Not on file  Transportation Needs: Not on file  Physical Activity: Not on file  Stress: Not on file  Social Connections: Not on file  Intimate Partner Violence: Not on file    Family History  Problem Relation Age of Onset   Hypertension  Father     Past Surgical History:  Procedure Laterality Date   CARDIOVERSION N/A 08/07/2018   Procedure: CARDIOVERSION;  Surgeon: , MD;  Location: [facility] ENDOSCOPY;  Service: Cardiovascular;  Laterality: N/A;   TEE WITHOUT CARDIOVERSION N/A 08/07/2018   Procedure: TRANSESOPHAGEAL ECHOCARDIOGRAM (TEE);  Surgeon: , MD;  Location: [facility] ENDOSCOPY;  Service: Cardiovascular;  Laterality: N/A;    ROS: Review of Systems Negative except as stated above  PHYSICAL EXAM: BP (!) 142/91 (BP Location: Left Arm, Patient Position: Sitting, Cuff Size: Large)   Pulse 70   Temp 98.1 F (36.7 C)   Resp 18   Ht 5' 7.99" (1.727 m)   Wt 217 lb (98.4 kg)   BMI 33.00 kg/m    Physical Exam HENT:     Head: Normocephalic and atraumatic.     Right Ear: Tympanic membrane, ear canal and external ear normal.     Left Ear: Tympanic membrane, ear canal and external ear normal.  Eyes:     Extraocular Movements: Extraocular movements intact.     Conjunctiva/sclera: Conjunctivae normal.     Pupils: Pupils are equal, round, and reactive to light.  Cardiovascular:     Rate and Rhythm: Normal rate and regular rhythm.     Pulses: Normal pulses.     Heart sounds: Normal heart  sounds.  Pulmonary:     Effort: Pulmonary effort is normal.     Breath sounds: Normal breath sounds.  Abdominal:     General: Bowel sounds are normal.     Palpations: Abdomen is soft.  Genitourinary:    Comments: Patient declined exam. Musculoskeletal:        General: Normal range of motion.     Cervical back: Normal range of motion and neck supple.  Skin:    General: Skin is warm and dry.     Capillary Refill: Capillary refill takes less than 2 seconds.  Neurological:     General: No focal deficit present.     Mental Status: He is alert and oriented to person, place, and time.  Psychiatric:        Mood and Affect: Mood normal.   ASSESSMENT AND PLAN: 1. Annual physical exam: - Counseled on 150 minutes  of exercise per week, healthy eating (including decreased daily intake of saturated fats, cholesterol, added sugars, sodium), STI prevention, routine healthcare maintenance.  2. Screening for metabolic disorder: - BMP last obtained 09/23/2021. - Hepatic Function Panel  3. Screening for deficiency anemia: - CBC last obtained 09/23/2021.  4. Diabetes mellitus screening: - Hemoglobin A1c to screen for pre-diabetes/diabetes. - Hemoglobin A1c  5. Screening cholesterol level: - Lipid panel to screen for high cholesterol.  - Lipid panel  6. Thyroid disorder screen: - TSH to check thyroid function.  - TSH  7. Need for hepatitis C screening test: - Hepatitis C antibody to screen for hepatitis C.  - Hepatitis C Antibody  8. Colon cancer screening: - Referral to Gastroenterology for colon cancer screening by colonoscopy. - Ambulatory referral to Gastroenterology  9. Primary hypertension: - Newly diagnosed.  - Begin Losartan as prescribed.  - Counseled on blood pressure goal of less than 130/80, low-sodium, DASH diet, medication compliance, 150 minutes of moderate intensity exercise per week as tolerated. Discussed medication compliance, adverse effects. - Follow-up with primary provider in 2 weeks or sooner if needed.  - losartan (COZAAR) 25 MG tablet; Take 1 tablet (25 mg total) by mouth daily.  Dispense: 30 tablet; Refill: 0  10. History of atrial flutter: - Patient today in office without cardiopulmonary distress.  - Patient reports intermittent chest pain and shortness of breath. Reports unhappy with current care at Cardiology. Currently followed by Pulmonology for dyspnea, chronic cough, and wheezing.  - Referral to Cardiology for further evaluation and management.  - Ambulatory referral to Cardiology  11. Need for shingles vaccine: - Administered today in office. - Varicella-zoster vaccine IM  12. Need for pneumococcal vaccine: - Administered today in office.  - Pneumococcal  conjugate vaccine 20-valent   Patient was given the opportunity to ask questions.  Patient verbalized understanding of the plan and was able to repeat key elements of the plan. Patient was given clear instructions to go to Emergency Department or return to medical center if symptoms don't improve, worsen, or new problems develop.The patient verbalized understanding.   Orders Placed This Encounter  Procedures   Pneumococcal conjugate vaccine 20-valent   Varicella-zoster vaccine IM   Hepatitis C Antibody   Hepatic Function Panel   Lipid panel   TSH   Hemoglobin A1c   Ambulatory referral to Gastroenterology     Requested Prescriptions   Signed Prescriptions Disp Refills   losartan (COZAAR) 25 MG tablet 30 tablet 0    Sig: Take 1 tablet (25 mg total) by mouth daily.    Return in  about 1 year (around 09/29/2022) for Physical per patient preference and 2 weeks hypertension .  Rema Fendt, NP

## 2021-09-29 ENCOUNTER — Ambulatory Visit (INDEPENDENT_AMBULATORY_CARE_PROVIDER_SITE_OTHER): Payer: 59 | Admitting: Family

## 2021-09-29 ENCOUNTER — Other Ambulatory Visit: Payer: Self-pay

## 2021-09-29 ENCOUNTER — Encounter: Payer: Self-pay | Admitting: Family

## 2021-09-29 ENCOUNTER — Ambulatory Visit: Payer: 59 | Admitting: Cardiology

## 2021-09-29 VITALS — BP 142/91 | HR 70 | Temp 98.1°F | Resp 18 | Ht 67.99 in | Wt 217.0 lb

## 2021-09-29 DIAGNOSIS — Z131 Encounter for screening for diabetes mellitus: Secondary | ICD-10-CM

## 2021-09-29 DIAGNOSIS — Z130 Encounter for screening for diseases of the blood and blood-forming organs and certain disorders involving the immune mechanism: Secondary | ICD-10-CM

## 2021-09-29 DIAGNOSIS — Z13220 Encounter for screening for lipoid disorders: Secondary | ICD-10-CM

## 2021-09-29 DIAGNOSIS — Z23 Encounter for immunization: Secondary | ICD-10-CM | POA: Diagnosis not present

## 2021-09-29 DIAGNOSIS — Z13228 Encounter for screening for other metabolic disorders: Secondary | ICD-10-CM | POA: Diagnosis not present

## 2021-09-29 DIAGNOSIS — Z0000 Encounter for general adult medical examination without abnormal findings: Secondary | ICD-10-CM | POA: Diagnosis not present

## 2021-09-29 DIAGNOSIS — Z1211 Encounter for screening for malignant neoplasm of colon: Secondary | ICD-10-CM

## 2021-09-29 DIAGNOSIS — Z8679 Personal history of other diseases of the circulatory system: Secondary | ICD-10-CM

## 2021-09-29 DIAGNOSIS — I10 Essential (primary) hypertension: Secondary | ICD-10-CM

## 2021-09-29 DIAGNOSIS — Z1329 Encounter for screening for other suspected endocrine disorder: Secondary | ICD-10-CM

## 2021-09-29 DIAGNOSIS — Z1159 Encounter for screening for other viral diseases: Secondary | ICD-10-CM

## 2021-09-29 HISTORY — DX: Essential (primary) hypertension: I10

## 2021-09-29 MED ORDER — LOSARTAN POTASSIUM 25 MG PO TABS: 25.0000 mg | ORAL_TABLET | Freq: Every day | ORAL | 0 refills | Status: DC

## 2021-09-29 NOTE — Progress Notes (Signed)
Pt presents for annual physical exam, 

## 2021-09-29 NOTE — Patient Instructions (Signed)
Cuidados preventivos en los hombres de 40 a 64 aos de edad Preventive Care 34-52 Years Old, Male Los cuidados preventivos hacen referencia a las opciones en cuanto al estilo de vida y a las visitas al mdico, las cuales pueden promover la salud y Counsellor. Las visitas de cuidado preventivo tambin se denominan exmenes de Health visitor. Qu puedo esperar para mi visita de cuidado preventivo? Asesoramiento Durante la visita de cuidado preventivo, el mdico puede preguntarle sobre lo siguiente: Antecedentes mdicos, incluidos los siguientes: Problemas mdicos pasados. Antecedentes mdicos familiares. Salud actual, incluido lo siguiente: Su bienestar emocional. Training and development officer y las relaciones personales. Su actividad sexual. Estilo de vida, incluido lo siguiente: Consumo de alcohol, nicotina, tabaco o drogas. Acceso a armas de fuego. Hbitos de alimentacin, ejercicio y sueo. Cuestiones de seguridad, como el uso de cinturn de seguridad y casco de Scientist, research (physical sciences). Uso de pantalla solar. Su trabajo y [place] laboral. Examen fsico El mdico revisar lo siguiente: Diplomatic Services operational officer y [place]. Estos pueden usarse para calcular el IMC (ndice de masa corporal). El [facility] es una medicin que indica si tiene un peso saludable. Circunferencia de la cintura. Es Neomia Dear medicin alrededor de Lobbyist. Esta medicin tambin indica si tiene un peso saludable y puede ayudar a predecir su riesgo de padecer ciertas enfermedades, como diabetes tipo 2 y presin arterial alta. Frecuencia cardaca y presin arterial. Temperatura corporal. Piel para detectar manchas anormales. Qu vacunas necesito? Las vacunas se aplican a varias edades, segn un cronograma. El [employer] recomendar vacunas segn su edad, sus antecedentes mdicos, su estilo de vida y [address], como los viajes o el lugar donde trabaja. Qu pruebas necesito? Pruebas de deteccin El mdico puede recomendar pruebas de deteccin de ciertas  afecciones. Esto puede incluir: Niveles de lpidos y colesterol. Pruebas de deteccin de la diabetes. Esto se Physiological scientist un control del azcar en la sangre (glucosa) despus de no haber comido durante un periodo de tiempo (ayuno). Prueba de hepatitis B. Prueba de hepatitis C. Prueba del VIH (virus de inmunodeficiencia humana). Pruebas de infecciones de transmisin sexual (ITS), si est en riesgo. Pruebas de deteccin de cncer de pulmn. Examen de deteccin del cncer de prstata. Pruebas de deteccin de Building services engineer. Hable con su mdico sobre los [employer], las opciones de tratamiento y, si corresponde, la necesidad de Education officer, environmental ms pruebas. Siga estas instrucciones en su casa: Comida y bebida  Siga una dieta que incluya frutas y verduras frescas, cereales integrales, protenas magras y productos lcteos descremados. Tome los suplementos vitamnicos y [employer] se lo haya indicado el mdico. No beba alcohol si el mdico se lo prohbe. Si bebe alcohol: Limite la cantidad que consume de 0 a 2 medidas por da. Sepa cunta cantidad de alcohol hay en las bebidas que toma. En los [address], una medida equivale a una botella de cerveza de 12 oz (355 ml), un vaso de vino de 5 oz (148 ml) o un vaso de una bebida alcohlica de alta graduacin de 1 oz (44 ml). Estilo de [employer] dientes a la maana y a la noche con Conservator, museum/gallery con fluoruro. Use hilo dental una vez al da. Haga al menos 30 minutos de ejercicio, 5 o ms [address]. No consuma ningn producto que contenga nicotina o tabaco. Estos productos incluyen cigarrillos, tabaco para Theatre manager y aparatos de vapeo, como los Administrator, Civil Service. Si necesita ayuda para dejar de fumar, consulte al mdico. No consuma drogas. Si es sexualmente [place], practique  sexo seguro. Use un condn u otra forma de proteccin para prevenir las infecciones de transmisin sexual (ITS). Tome aspirina nicamente  como se lo haya indicado el mdico. Asegrese de que comprende qu cantidad y cul presentacin debe tomar. Trabaje con el mdico para averiguar si es seguro y beneficioso para usted tomar aspirina a diario. Busque maneras saludables de Charity fundraiser, tales como: Meditacin, yoga o Optometrist. Lleve un diario personal. Hable con una persona confiable. Pase tiempo con amigos y familiares. Minimice la exposicin a la radiacin UV para reducir el riesgo de cncer de piel. Seguridad [place] siempre el cinturn de seguridad al conducir o viajar en un vehculo. No conduzca: Si ha estado bebiendo alcohol. No viaje con un conductor que ha estado bebiendo. Si est cansado o distrado. Mientras est enviando mensajes de texto. Si ha estado usando sustancias o drogas que alteran la funcin mental. Use un casco y otros equipos de proteccin durante las actividades deportivas. Si tiene armas de fuego en su casa, asegrese de seguir todos los procedimientos de seguridad correspondientes. Cundo volver? Acuda al mdico una vez al ao para una visita anual de control de bienestar. Pregntele al mdico con qu frecuencia debe realizarse un control de la vista y los dientes. Mantenga su esquema de vacunacin al da. Esta informacin no tiene Theme park manager el consejo del mdico. Asegrese de hacerle al mdico cualquier pregunta que tenga. Document Revised: 05/21/2021 Document Reviewed: 05/21/2021 Elsevier Patient Education  2022 [employer].

## 2021-09-30 ENCOUNTER — Other Ambulatory Visit: Payer: Self-pay | Admitting: Family

## 2021-09-30 DIAGNOSIS — E785 Hyperlipidemia, unspecified: Secondary | ICD-10-CM

## 2021-09-30 DIAGNOSIS — E119 Type 2 diabetes mellitus without complications: Secondary | ICD-10-CM | POA: Insufficient documentation

## 2021-09-30 HISTORY — DX: Hyperlipidemia, unspecified: E78.5

## 2021-09-30 HISTORY — DX: Type 2 diabetes mellitus without complications: E11.9

## 2021-09-30 LAB — LIPID PANEL
Chol/HDL Ratio: 7.2 ratio — ABNORMAL HIGH (ref 0.0–5.0)
Cholesterol, Total: 224 mg/dL — ABNORMAL HIGH (ref 100–199)
HDL: 31 mg/dL — ABNORMAL LOW (ref >39)
LDL Chol Calc (NIH): 172 mg/dL — ABNORMAL HIGH (ref 0–99)
Triglycerides: 115 mg/dL (ref 0–149)
VLDL Cholesterol Cal: 21 mg/dL (ref 5–40)

## 2021-09-30 LAB — HEPATIC FUNCTION PANEL
ALT: 41 IU/L (ref 0–44)
AST: 24 IU/L (ref 0–40)
Albumin: 4.4 g/dL (ref 3.8–4.9)
Alkaline Phosphatase: 91 IU/L (ref 44–121)
Bilirubin Total: 0.5 mg/dL (ref 0.0–1.2)
Bilirubin, Direct: 0.12 mg/dL (ref 0.00–0.40)
Total Protein: 7.6 g/dL (ref 6.0–8.5)

## 2021-09-30 LAB — HEMOGLOBIN A1C
Est. average glucose Bld gHb Est-mCnc: 146 mg/dL
Hgb A1c MFr Bld: 6.7 % — ABNORMAL HIGH (ref 4.8–5.6)

## 2021-09-30 LAB — HEPATITIS C ANTIBODY: Hep C Virus Ab: 0.3 s/co ratio (ref 0.0–0.9)

## 2021-09-30 LAB — TSH: TSH: 1.24 u[IU]/mL (ref 0.450–4.500)

## 2021-09-30 MED ORDER — ATORVASTATIN CALCIUM 20 MG PO TABS: 20.0000 mg | ORAL_TABLET | Freq: Every day | ORAL | 0 refills | Status: DC

## 2021-09-30 MED ORDER — METFORMIN HCL 500 MG PO TABS: 500.0000 mg | ORAL_TABLET | Freq: Two times a day (BID) | ORAL | 0 refills | Status: DC

## 2021-09-30 NOTE — Progress Notes (Signed)
Please call patient with update.   Liver function normal.   Thyroid function normal.   Hepatitis C negative.   Hemoglobin A1c is consistent with diabetes. Practice healthy eating habits of fresh fruit and vegetables, lean baked meats such as chicken, fish, and [place]; limit breads, rice, pastas, and desserts; practice regular aerobic exercise (at least 150 minutes a week as tolerated).  Begin Metformin (Glucophage) for diabetes. Follow-up with primary provider in 4 weeks or sooner if needed.   Cholesterol higher than expected. High cholesterol may increase risk of heart attack and/or stroke. Consider eating more fruits, vegetables, and lean baked meats such as chicken or fish. Moderate intensity exercise at least 150 minutes as tolerated per week may help as well.   Begin Atorvastatin (Lipitor) for high cholesterol. Encouraged to schedule lab only appointment to have cholesterol rechecked in 4 to 6 weeks.   The following is for provider reference only: The 10-year ASCVD risk score (Arnett DK, et al., 2019) is: 10.8%   Values used to calculate the score:     Age: 66 years     Sex: Male     Is Non-Hispanic African American: No     Diabetic: No     Tobacco smoker: No     Systolic Blood Pressure: 142 mmHg     Is BP treated: Yes     HDL Cholesterol: 31 mg/dL     Total Cholesterol: 224 mg/dL

## 2021-10-06 ENCOUNTER — Other Ambulatory Visit: Payer: Self-pay

## 2021-10-06 ENCOUNTER — Ambulatory Visit (INDEPENDENT_AMBULATORY_CARE_PROVIDER_SITE_OTHER)
Admission: RE | Admit: 2021-10-06 | Discharge: 2021-10-06 | Disposition: A | Discharge status: Home / Self Care | Payer: 59 | Source: Ambulatory Visit | Attending: Internal Medicine | Admitting: Internal Medicine

## 2021-10-06 DIAGNOSIS — R0600 Dyspnea, unspecified: Secondary | ICD-10-CM

## 2021-10-06 DIAGNOSIS — R053 Chronic cough: Secondary | ICD-10-CM | POA: Diagnosis not present

## 2021-10-06 DIAGNOSIS — R9389 Abnormal findings on diagnostic imaging of other specified body structures: Secondary | ICD-10-CM

## 2021-10-06 DIAGNOSIS — Z87898 Personal history of other specified conditions: Secondary | ICD-10-CM

## 2021-10-16 ENCOUNTER — Ambulatory Visit: Payer: 59 | Admitting: Family

## 2021-10-19 NOTE — Progress Notes (Signed)
Erroneous encounter

## 2021-10-23 ENCOUNTER — Encounter: Payer: 59 | Admitting: Family

## 2021-10-23 DIAGNOSIS — E785 Hyperlipidemia, unspecified: Secondary | ICD-10-CM

## 2021-10-23 DIAGNOSIS — E119 Type 2 diabetes mellitus without complications: Secondary | ICD-10-CM

## 2021-10-29 NOTE — Progress Notes (Deleted)
Cardiology Office Note:    Date:  10/29/2021   ID:  Kenneth Marsh, [name] 03-25-1971, MRN [id number]  PCP:  Rema Fendt, NP  Cardiologist:  Norman Herrlich, MD   Referring MD: Rema Fendt, NP  ASSESSMENT:    No diagnosis found. PLAN:    In order of problems listed above:  ***  Next appointment   Medication Adjustments/Labs and Tests Ordered: Current medicines are reviewed at length with the patient today.  Concerns regarding medicines are outlined above.  No orders of the defined types were placed in this encounter.  No orders of the defined types were placed in this encounter.    No chief complaint Marsh file. ***  History of Present Illness:    Kenneth Marsh is a 50 y.o. male with recently recognized and treated  hypertension, type 2 diabetes and hyperlipidemia who is being seen today for the evaluation of coronary artery atherosclerosis Marsh CT scan at the request of Rema Fendt, NP.  His 10-year cardiovascular risk is high 10.8%.  Referral note also relates that he has had chest pain.  He has been seen by heart care electrophysiology Dr. Elberta Fortis with atrial flutter.He was admitted to [facility] September 2019 with chest discomfort.  He was found to be in atrial flutter with heart rates in the 140s.  He was started Marsh Eliquis and received TEE and cardioversion at that time.  He has had no clinical recurrence.  TEE 08/14/18  Review of the above records today demonstrates:  - Left ventricle: The cavity size was normal. Systolic function was   normal. The estimated ejection fraction was 55%. Wall motion was   normal; there were no regional wall motion abnormalities. No   evidence of thrombus. - Aortic valve: There was no stenosis. - Aorta: Normal caliber aorta with minimal plaque. - Mitral valve: There was mild to moderate regurgitation. - Left atrium: The atrium was mildly dilated. No evidence of   thrombus in the atrial cavity or appendage. No  evidence of   thrombus in the atrial cavity or appendage. - Right ventricle: The cavity size was normal. Systolic function   was normal. - Right atrium: No evidence of thrombus in the atrial cavity or   appendage.  He has been seen by pulmonary 09/23/2021 for wheezing and shortness of breath at night. High-resolution chest CT showed subtle findings concerning for interstitial lung disease He also had aortic and coronary atherosclerosis and small pericardial cyst which have been seen previously. proBNP level is a screen for heart failure was normal 22 CBC was normal normal renal function creatinine 0.85 GFR 90 D 9 cc/min Past Medical History:  Diagnosis Date   Atrial flutter [facility])     Past Surgical History:  Procedure Laterality Date   CARDIOVERSION N/A 08/07/2018   Procedure: CARDIOVERSION;  Surgeon: Laurey Morale, MD;  Location: [facility] ENDOSCOPY;  Service: Cardiovascular;  Laterality: N/A;   TEE WITHOUT CARDIOVERSION N/A 08/07/2018   Procedure: TRANSESOPHAGEAL ECHOCARDIOGRAM (TEE);  Surgeon: Laurey Morale, MD;  Location: [facility] ENDOSCOPY;  Service: Cardiovascular;  Laterality: N/A;    Current Medications: No outpatient medications have been marked as taking for the 10/30/21 encounter (Appointment) with Baldo Daub, MD.     Allergies:   Patient has no known allergies.   Social History   Socioeconomic History   Marital status: Divorced    Spouse name: Not Marsh file   Number of children: Not Marsh file   Years of  education: Not Marsh file   Highest education level: Not Marsh file  Occupational History   Not Marsh file  Tobacco Use   Smoking status: Never   Smokeless tobacco: Never  Substance and Sexual Activity   Alcohol use: Not Currently   Drug use: No   Sexual activity: Yes  Other Topics Concern   Not Marsh file  Social History Narrative   Not Marsh file   Social Determinants of Health   Financial Resource Strain: Not Marsh file  Food Insecurity: Not Marsh file  Transportation Needs:  Not Marsh file  Physical Activity: Not Marsh file  Stress: Not Marsh file  Social Connections: Not Marsh file     Family History: The patient's ***family history includes Hypertension in his father.  ROS:   ROS Please see the history of present illness.    *** All other systems reviewed and are negative.  EKGs/Labs/Other Studies Reviewed:    The following studies were reviewed today: ***  EKG:  EKG is *** ordered today.  The ekg ordered today is personally reviewed and demonstrates ***  Recent Labs: 09/23/2021: BUN 15; Creatinine, Ser 0.85; Hemoglobin 15.3; Platelets 179.0; Potassium 3.8; Pro B Natriuretic peptide (BNP) 22.0; Sodium 135 09/29/2021: ALT 41; TSH 1.240  Recent Lipid Panel    Component Value Date/Time   CHOL 224 (H) 09/29/2021 1021   TRIG 115 09/29/2021 1021   HDL 31 (L) 09/29/2021 1021   CHOLHDL 7.2 (H) 09/29/2021 1021   LDLCALC 172 (H) 09/29/2021 1021    Physical Exam:    VS:  There were no vitals taken for this visit.    Wt Readings from Last 3 Encounters:  09/29/21 217 lb (98.4 kg)  09/23/21 220 lb (99.8 kg)  08/18/21 217 lb (98.4 kg)     GEN: *** Well nourished, well developed in no acute distress HEENT: Normal NECK: No JVD; No carotid bruits LYMPHATICS: No lymphadenopathy CARDIAC: ***RRR, no murmurs, rubs, gallops RESPIRATORY:  Clear to auscultation without rales, wheezing or rhonchi  ABDOMEN: Soft, non-tender, non-distended MUSCULOSKELETAL:  No edema; No deformity  SKIN: Warm and dry NEUROLOGIC:  Alert and oriented x 3 PSYCHIATRIC:  Normal affect     Signed, Norman Herrlich, MD  10/29/2021 10:11 AM    [facility]

## 2021-10-30 ENCOUNTER — Ambulatory Visit: Payer: 59 | Admitting: Cardiology

## 2021-11-19 ENCOUNTER — Ambulatory Visit: Payer: 59 | Admitting: Primary Care

## 2021-11-19 ENCOUNTER — Other Ambulatory Visit: Payer: Self-pay

## 2021-11-26 ENCOUNTER — Ambulatory Visit: Payer: 59 | Admitting: Cardiology

## 2022-01-12 ENCOUNTER — Encounter: Payer: Self-pay | Admitting: Family

## 2022-01-12 ENCOUNTER — Other Ambulatory Visit: Payer: Self-pay

## 2022-01-12 ENCOUNTER — Ambulatory Visit (INDEPENDENT_AMBULATORY_CARE_PROVIDER_SITE_OTHER): Payer: 59 | Admitting: Family

## 2022-01-12 VITALS — BP 123/75 | HR 84 | Temp 98.4°F | Resp 18 | Ht 67.99 in | Wt 214.6 lb

## 2022-01-12 DIAGNOSIS — Z23 Encounter for immunization: Secondary | ICD-10-CM | POA: Diagnosis not present

## 2022-01-12 DIAGNOSIS — Z789 Other specified health status: Secondary | ICD-10-CM

## 2022-01-12 DIAGNOSIS — E119 Type 2 diabetes mellitus without complications: Secondary | ICD-10-CM | POA: Diagnosis not present

## 2022-01-12 DIAGNOSIS — I10 Essential (primary) hypertension: Secondary | ICD-10-CM

## 2022-01-12 DIAGNOSIS — E785 Hyperlipidemia, unspecified: Secondary | ICD-10-CM

## 2022-01-12 DIAGNOSIS — Z8679 Personal history of other diseases of the circulatory system: Secondary | ICD-10-CM

## 2022-01-12 DIAGNOSIS — Z758 Other problems related to medical facilities and other health care: Secondary | ICD-10-CM

## 2022-01-12 DIAGNOSIS — R062 Wheezing: Secondary | ICD-10-CM

## 2022-01-12 DIAGNOSIS — R053 Chronic cough: Secondary | ICD-10-CM

## 2022-01-12 DIAGNOSIS — R0600 Dyspnea, unspecified: Secondary | ICD-10-CM

## 2022-01-12 DIAGNOSIS — R079 Chest pain, unspecified: Secondary | ICD-10-CM | POA: Diagnosis not present

## 2022-01-12 MED ORDER — ATORVASTATIN CALCIUM 20 MG PO TABS: 20.0000 mg | ORAL_TABLET | Freq: Every day | ORAL | 0 refills | Status: DC

## 2022-01-12 MED ORDER — LOSARTAN POTASSIUM 25 MG PO TABS: 25.0000 mg | ORAL_TABLET | Freq: Every day | ORAL | 0 refills | Status: DC

## 2022-01-12 NOTE — Patient Instructions (Signed)
- Please call [facility] [address] [place], [place] [address] Phone#: [phone]. - Keep all appointments with Pulmonology.   Fibrilacin auricular Atrial Fibrillation La fibrilacin auricular es un tipo de latido cardaco irregular o rpido (arritmia). En la fibrilacin auricular, la parte superior del corazn (aurculas) late con un patrn irregular. Esto hace que el corazn no pueda bombear sangre de [place] normal y Engineer, manufacturing. [place] del tratamiento es prevenir la formacin de cogulos de [place], Chief Operating Officer la frecuencia cardaca o Ambulance person el ritmo normal de los latidos cardacos. Si esta afeccin no se trata, puede causar graves complicaciones, como el debilitamiento del msculo cardaco (miocardiopata) o un accidente cerebrovascular. Cules son las causas? Esta afeccin suele ser causada por otras afecciones que daan el sistema elctrico del corazn. Estas incluyen lo siguiente: Presin arterial alta (hipertensin arterial). Esta es la causa ms frecuente. Ciertos problemas o afecciones del corazn, como insuficiencia cardaca, arteriopata coronaria, problemas en las vlvulas cardacas o ciruga cardaca. Diabetes. Hiperactividad de la glndula tiroidea (hipertiroidismo). Obesidad. Enfermedad renal crnica. En algunos casos, se desconoce la causa de esta afeccin. Qu incrementa el riesgo? Es ms probable que Dietitian en: Personas de edad avanzada. Fumadores. Deportistas que realizan ejercicios de resistencia. Las personas que tienen antecedentes familiares de fibrilacin auricular. Hombres. Personas que consumen drogas. Personas que beben mucho alcohol. Personas con afecciones pulmonares, como enfisema, neumona o EPOC. Personas que tienen apnea obstructiva del sueo. Cules son los signos o sntomas? Los sntomas de esta afeccin incluyen: Sensacin de que el corazn late rpida o irregularmente. Malestar o Engineer, manufacturing. Falta de aire. Mareos o debilidad repentinos. Cansarse fcilmente al hacer ejercicio o actividad fsica. Fatiga. Sncope (episodio de desmayo). Sudoracin. En algunos casos, no hay sntomas. Cmo se diagnostica? El mdico puede detectar la fibrilacin auricular al tomarle el pulso. Si se detecta, esta afeccin podra diagnosticarse a travs de lo siguiente: Un electrocardiograma (ECG) para controlar las seales elctricas del corazn. Un monitor cardaco ambulatorio para Landscape architect actividad del corazn durante [address]. Un ecocardiograma transtorcico (ETT) para obtener imgenes del corazn. Un ecocardiograma transesofgico (ETE) para obtener imgenes incluso ms cercanas del corazn. Una ergometra para evaluar la irrigacin sangunea mientras hace ejercicio. Estudios de diagnstico por imgenes, como una exploracin por tomografa computarizada (TC) o una radiografa torcica. Anlisis de [place]. Cmo se trata? El tratamiento depende de las afecciones subyacentes y de cmo se siente cuando tiene fibrilacin auricular. El tratamiento de esta afeccin puede incluir: Medicamentos para prevenir la formacin de cogulos de sangre o tratar los problemas de frecuencia cardaca o ritmo cardaco. Cardioversin elctrica para restablecer el ritmo cardaco. Un marcapasos para corregir el ritmo cardaco anormal. Ablacin para extirpar el tejido cardaco que enva seales anormales. Cierre de Electrical engineer auricular izquierda para sellar la zona donde pueden formarse cogulos de [place]. En algunos casos, se tratarn las afecciones subyacentes. Siga estas instrucciones en su casa: Medicamentos Tome los medicamentos de venta libre y los recetados solamente como se lo haya indicado el mdico. No tome medicamentos nuevos sin antes Science writer con su mdico. Si est tomando anticoagulantes, tenga en cuenta lo siguiente: Hable con el mdico antes de tomar cualquier medicamento que contenga  aspirina o antiinflamatorios no esteroideos (AINE), como el ibuprofeno. Estos medicamentos aumentan el riesgo de tener una hemorragia peligrosa. Tome los [employer] Systems se lo indicaron, [employer] a la misma hora. Evite las actividades que podran causarle lesiones o moretones y Adult nurse  las instrucciones para evitar las cadas. Use un brazalete de alerta mdica o lleve una tarjeta con una lista de los medicamentos que toma. Estilo de vida   No consuma ningn producto que contenga nicotina o tabaco, como cigarrillos, cigarrillos electrnicos y tabaco de Theatre manager. Si necesita ayuda para dejar de fumar, consulte al mdico. Consuma alimentos cardiosaludables. Hable con un nutricionista para crear un plan de alimentacin que sea adecuado para usted. Haga actividad fsica habitualmente como se lo haya indicado el mdico. No beba alcohol. Baje de peso si es necesario. No consuma drogas, ni siquiera cannabis. Instrucciones generales Si tiene apnea obstructiva del sueo, controle la afeccin como se lo haya indicado el mdico. No tome pastillas para adelgazar a menos que el mdico lo autorice. Estas pastillas pueden agravar los problemas cardacos. Concurra a todas las visitas de [address] se lo haya indicado el mdico. Esto es importante. Comunquese con un mdico si: Nota un cambio en la frecuencia, el ritmo o la fuerza de los latidos cardacos. Toma anticoagulantes y observa ms moretones. Se cansa con ms facilidad cuando hace ejercicio o hace trabajos pesados. Tiene cambios repentinos en [employer]. Solicite ayuda inmediatamente si tiene:  Dolor de pecho, dolor abdominal, sudoracin o debilidad. Dificultad para respirar. Efectos secundarios de los anticoagulantes, como sangre en el vmito, las heces o la [place], o sangrado que no puede detenerse. Cualquier sntoma de un accidente cerebrovascular. BE FAST es una manera fcil de recordar las principales seales de advertencia  de un accidente cerebrovascular: B - Balance (equilibrio). Los signos son mareos, dificultad repentina para caminar o prdida del equilibrio. E - Eyes (ojos). Los signos son problemas para ver o un cambio repentino en la visin. F - Face (rostro). Los signos son debilidad repentina o adormecimiento del rostro, o el rostro o el prpado que se caen hacia un lado. A - Arms (brazos). Los signos son debilidad o adormecimiento en un brazo. Esto sucede de repente y generalmente en un lado del cuerpo. S - Speech (habla). Los signos son dificultad para hablar, hablar arrastrando las palabras o dificultad para comprender lo que la gente dice. T - Time (tiempo). Es tiempo de llamar al servicio de Sports administrator. Anote la hora a la que [employer] sntomas. Otros signos de accidente cerebrovascular, tales como: Dolor de cabeza sbito e intenso que no tiene causa aparente. Nuseas o vmitos. Convulsiones. Estos sntomas pueden representar un problema grave que constituye Radio broadcast assistant. No espere a ver si los sntomas desaparecen. Solicite atencin mdica de inmediato. Comunquese con el servicio de emergencias de su localidad (911 en los [place]). No conduzca por sus propios medios [employer]. Resumen La fibrilacin auricular es un tipo de latido cardaco irregular o rpido (arritmia). Los sntomas incluyen sensacin de que el corazn late rpida o irregularmente. Es posible que le den medicamentos para prevenir la formacin de cogulos de sangre o tratar los problemas de frecuencia cardaca o ritmo cardaco. Obtenga ayuda de inmediato si tiene signos o sntomas de un accidente cerebrovascular. Busque ayuda de inmediato si no puede respirar o siente dolor o presin en el pecho. Esta informacin no tiene Theme park manager el consejo del mdico. Asegrese de hacerle al mdico cualquier pregunta que tenga. Document Revised: 06/19/2019 Document Reviewed: 06/19/2019 Elsevier Patient Education   2022 [employer].

## 2022-01-12 NOTE — Progress Notes (Signed)
Pt presents for elevated BP while in [place] last week, provider suggested that he visits PCP when return to states.  Pt has complaints of fatigue  Needs refills on medications losartan, metformin and atorvastatin

## 2022-01-12 NOTE — Progress Notes (Signed)
Patient ID: Kenneth Marsh, male    DOB: May 22, 1971  MRN: [id number]  CC: Diabetes Follow-Up  Subjective: Kenneth Marsh is a 51 y.o. male who presents for diabetes follow-up.   His concerns today include:   DIABETES FOLLOW-UP: Doing well on current Metformin, no issues/concerns. Recently visited [place] had blood work and told he does not have diabetes.   2. HYPERTENSION FOLLOW-UP: 09/29/2021: - Newly diagnosed.  - Begin Losartan as prescribed.   01/12/2022: Doing well on current regimen. No side effects. No issues/concerns. Denies shortness of breath and additional red flag symptoms. Has chest pain (see below).   3. HYPERLIPIDEMIA FOLLOW-UP: Doing well on current regimen, no issues/concerns.   4. HISTORY OF ATRIAL FLUTTER FOLLOW-UP: 5. CHEST PAIN: 09/29/2021: - Patient today in office without cardiopulmonary distress.  - Patient reports intermittent chest pain and shortness of breath. Reports unhappy with current care at Cardiology. Currently followed by Pulmonology for dyspnea, chronic cough, and wheezing.  - Referral to Cardiology for further evaluation and management.   01/12/2022: Intermittent chest pain persisting. Usually at bedtime. Described as mild intensity lasting for a few minutes. No active chest pain today in office. Reports recently missed appointment with Cardiology because was in [place].   Patient Active Problem List   Diagnosis Date Noted   Diabetes mellitus, type 2 (HCC) 09/30/2021   Hyperlipidemia 09/30/2021   Primary hypertension 09/29/2021   Atrial flutter (HCC) 08/06/2018     Current Outpatient Medications on File Prior to Visit  Medication Sig Dispense Refill   albuterol (VENTOLIN HFA) 108 (90 Base) MCG/ACT inhaler Inhale 2 puffs into the lungs every 6 (six) hours as needed for wheezing or shortness of breath. 16 g 1   metFORMIN (GLUCOPHAGE) 500 MG tablet Take 500 mg by mouth 2 (two) times daily with a meal.     No current  facility-administered medications on file prior to visit.    No Known Allergies  Social History   Socioeconomic History   Marital status: Divorced    Spouse name: Not on file   Number of children: Not on file   Years of education: Not on file   Highest education level: Not on file  Occupational History   Not on file  Tobacco Use   Smoking status: Never    Passive exposure: Never   Smokeless tobacco: Never  Substance and Sexual Activity   Alcohol use: Not Currently   Drug use: No   Sexual activity: Yes  Other Topics Concern   Not on file  Social History Narrative   Not on file   Social Determinants of Health   Financial Resource Strain: Not on file  Food Insecurity: Not on file  Transportation Needs: Not on file  Physical Activity: Not on file  Stress: Not on file  Social Connections: Not on file  Intimate Partner Violence: Not on file    Family History  Problem Relation Age of Onset   Hypertension Father     Past Surgical History:  Procedure Laterality Date   CARDIOVERSION N/A 08/07/2018   Procedure: CARDIOVERSION;  Surgeon: Laurey Morale, MD;  Location: [facility] ENDOSCOPY;  Service: Cardiovascular;  Laterality: N/A;   TEE WITHOUT CARDIOVERSION N/A 08/07/2018   Procedure: TRANSESOPHAGEAL ECHOCARDIOGRAM (TEE);  Surgeon: Laurey Morale, MD;  Location: [facility] ENDOSCOPY;  Service: Cardiovascular;  Laterality: N/A;    ROS: Review of Systems Negative except as stated above  PHYSICAL EXAM: BP 123/75 (BP Location: Left Arm, Patient Position: Sitting, Cuff Size: Large)  Pulse 84    Temp 98.4 F (36.9 C)    Resp 18    Ht 5' 7.99" (1.727 m)    Wt 214 lb 9.6 oz (97.3 kg)    SpO2 93%    BMI 32.64 kg/m   Physical Exam HENT:     Head: Normocephalic and atraumatic.  Eyes:     Extraocular Movements: Extraocular movements intact.     Conjunctiva/sclera: Conjunctivae normal.     Pupils: Pupils are equal, round, and reactive to light.  Cardiovascular:     Rate and Rhythm:  Normal rate and regular rhythm.     Pulses: Normal pulses.     Heart sounds: Normal heart sounds.  Pulmonary:     Effort: Pulmonary effort is normal.     Breath sounds: Normal breath sounds.  Musculoskeletal:     Cervical back: Normal range of motion and neck supple.  Neurological:     General: No focal deficit present.     Mental Status: He is alert and oriented to person, place, and time.  Psychiatric:        Mood and Affect: Mood normal.        Behavior: Behavior normal.   ASSESSMENT AND PLAN: 1. Type 2 diabetes mellitus without complication, without long-term current use of insulin (HCC): - Will update hemoglobin A1c today. Metformin refill pending results.  - Discussed the importance of healthy eating habits, low-carbohydrate diet, low-sugar diet, regular aerobic exercise (at least 150 minutes a week as tolerated) and medication compliance to achieve or maintain control of diabetes. - Follow-up with primary provider as scheduled.  - Hemoglobin A1c  2. Essential (primary) hypertension: - Continue Losartan as prescribed.  - Counseled on blood pressure goal of less than 130/80, low-sodium, DASH diet, medication compliance, 150 minutes of moderate intensity exercise per week as tolerated. Discussed medication compliance, adverse effects. - Follow-up with primary provider in 3 months or sooner if needed.  - losartan (COZAAR) 25 MG tablet; Take 1 tablet (25 mg total) by mouth daily.  Dispense: 90 tablet; Refill: 0  3. Hyperlipidemia, unspecified hyperlipidemia type: - Continue Atorvastatin as prescribed.  - Follow-up with primary provider as scheduled.  - atorvastatin (LIPITOR) 20 MG tablet; Take 1 tablet (20 mg total) by mouth daily.  Dispense: 120 tablet; Refill: 0  4. History of atrial flutter: 5. Chest pain, unspecified type: - Patient provided with contact information to [facility].   6. Dyspnea, unspecified type: 7. Chronic cough: 8. Wheezing: - Keep all scheduled  appointments with Pulmonology.   9. Need for shingles vaccine: - Administered today in office.  - Varicella-zoster vaccine IM  10. Language barrier: - [employer] participated during today's appointment. Interpreter Name: Janne Lab, ID#: [id number].    Patient was given the opportunity to ask questions.  Patient verbalized understanding of the plan and was able to repeat key elements of the plan. Patient was given clear instructions to go to Emergency Department or return to medical center if symptoms don't improve, worsen, or new problems develop.The patient verbalized understanding.   Orders Placed This Encounter  Procedures   Varicella-zoster vaccine IM   Hemoglobin A1c     Requested Prescriptions   Signed Prescriptions Disp Refills   losartan (COZAAR) 25 MG tablet 90 tablet 0    Sig: Take 1 tablet (25 mg total) by mouth daily.   atorvastatin (LIPITOR) 20 MG tablet 120 tablet 0    Sig: Take 1 tablet (20 mg total) by mouth daily.  Return in about 3 months (around 04/11/2022) for Follow-Up or next available hypertension .  Rema Fendt, NP

## 2022-01-13 ENCOUNTER — Other Ambulatory Visit: Payer: Self-pay | Admitting: Family

## 2022-01-13 ENCOUNTER — Telehealth: Payer: Self-pay | Admitting: Family

## 2022-01-13 DIAGNOSIS — E119 Type 2 diabetes mellitus without complications: Secondary | ICD-10-CM

## 2022-01-13 DIAGNOSIS — I10 Essential (primary) hypertension: Secondary | ICD-10-CM

## 2022-01-13 DIAGNOSIS — I771 Stricture of artery: Secondary | ICD-10-CM

## 2022-01-13 DIAGNOSIS — I4892 Unspecified atrial flutter: Secondary | ICD-10-CM

## 2022-01-13 DIAGNOSIS — R079 Chest pain, unspecified: Secondary | ICD-10-CM

## 2022-01-13 LAB — HEMOGLOBIN A1C
Est. average glucose Bld gHb Est-mCnc: 137 mg/dL
Hgb A1c MFr Bld: 6.4 % — ABNORMAL HIGH (ref 4.8–5.6)

## 2022-01-13 MED ORDER — METFORMIN HCL 500 MG PO TABS: 500.0000 mg | ORAL_TABLET | Freq: Two times a day (BID) | ORAL | 0 refills | Status: DC

## 2022-01-13 NOTE — Telephone Encounter (Signed)
Referral to Cardiology updated. Allow their office 2 weeks to call patient with appointment details.

## 2022-01-13 NOTE — Progress Notes (Signed)
Call patient with update.  Diabetes improved since 3 months ago and remaining at goal. Continue Metformin as prescribed. Follow-up in 3 months.

## 2022-01-13 NOTE — Telephone Encounter (Signed)
Pt's wife called to inform that she called per the pt's AVS Instructions: Please call [facility] [address] [place], [place] [address] Phone#: [phone]. ? ?This office told her that PCE needs to place another referral in order to schedule Kenneth Marsh. Pt needs assistance with  this from PCP please and thank you.  ?

## 2022-02-05 ENCOUNTER — Other Ambulatory Visit: Payer: Self-pay

## 2022-02-05 ENCOUNTER — Encounter: Payer: Self-pay | Admitting: Cardiology

## 2022-02-05 ENCOUNTER — Ambulatory Visit: Payer: 59 | Admitting: Cardiology

## 2022-02-05 VITALS — BP 168/100 | HR 75 | Ht 68.0 in | Wt 212.0 lb

## 2022-02-05 DIAGNOSIS — E119 Type 2 diabetes mellitus without complications: Secondary | ICD-10-CM

## 2022-02-05 DIAGNOSIS — I10 Essential (primary) hypertension: Secondary | ICD-10-CM

## 2022-02-05 DIAGNOSIS — I483 Typical atrial flutter: Secondary | ICD-10-CM

## 2022-02-05 DIAGNOSIS — E782 Mixed hyperlipidemia: Secondary | ICD-10-CM

## 2022-02-05 MED ORDER — ASPIRIN EC 81 MG PO TBEC: 81.0000 mg | DELAYED_RELEASE_TABLET | Freq: Every day | ORAL | 3 refills | Status: DC

## 2022-02-05 MED ORDER — LOSARTAN POTASSIUM 50 MG PO TABS: 50.0000 mg | ORAL_TABLET | Freq: Every day | ORAL | 3 refills | Status: DC

## 2022-02-05 NOTE — Progress Notes (Signed)
? ?Cardiology Consultation:   ? ?Date:  02/05/2022  ? ?ID:  Rajiv Parlato, [place] July 15, 1971, MRN [id number] ? ?PCP:  Rema Fendt, NP  ?Cardiologist:  Gypsy Balsam, MD  ? ?Referring MD: Rema Fendt, NP  ? ?No chief complaint on file. ?I have a problem with blood pressure would like to take care of it ? ?History of Present Illness:   ? ?Kenneth Marsh is a 51 y.o. male who is being seen today for the evaluation of paroxysmal atrial flutter, essential hypertension at the request of Rema Fendt, NP.  Past medical history significant for 1 documented episode of paroxysmal atrial flutter he required TEE and cardioversion thereafter more than 3 years ago.  Also essential hypertension, prediabetes, dyslipidemia.  He comes today because he would like to be established as a patient.  Overall he is doing well he described the fact that he is having headache when his blood pressure is high.  Described to have some palpitations but what he mean by that he feels his heart pounding forcefully when his blood pressure is high he also noted to have some chest pain but that happen only when his heart is beating forcefully when his blood pressure is high.  At the same time he can walk climb stairs exercise absolutely have no difficulty doing it.  He does not smoke does not drink he is not on any special diet.  He still works hard with no difficulties. ? ?Past Medical History:  ?Diagnosis Date  ? Atrial flutter (HCC)   ? Diabetes mellitus, type 2 (HCC) 09/30/2021  ? Hyperlipidemia 09/30/2021  ? Primary hypertension 09/29/2021  ? ? ?Past Surgical History:  ?Procedure Laterality Date  ? CARDIOVERSION N/A 08/07/2018  ? Procedure: CARDIOVERSION;  Surgeon: Laurey Morale, MD;  Location: [facility] ENDOSCOPY;  Service: Cardiovascular;  Laterality: N/A;  ? TEE WITHOUT CARDIOVERSION N/A 08/07/2018  ? Procedure: TRANSESOPHAGEAL ECHOCARDIOGRAM (TEE);  Surgeon: Laurey Morale, MD;  Location: [facility] ENDOSCOPY;  Service: Cardiovascular;   Laterality: N/A;  ? ? ?Current Medications: ?Current Meds  ?Medication Sig  ? albuterol (VENTOLIN HFA) 108 (90 Base) MCG/ACT inhaler Inhale 2 puffs into the lungs every 6 (six) hours as needed for wheezing or shortness of breath.  ? atorvastatin (LIPITOR) 20 MG tablet Take 1 tablet (20 mg total) by mouth daily.  ? losartan (COZAAR) 25 MG tablet Take 1 tablet (25 mg total) by mouth daily.  ? metFORMIN (GLUCOPHAGE) 500 MG tablet Take 1 tablet (500 mg total) by mouth 2 (two) times daily with a meal.  ?  ? ?Allergies:   Patient has no known allergies.  ? ?Social History  ? ?Socioeconomic History  ? Marital status: Divorced  ?  Spouse name: Not on file  ? Number of children: Not on file  ? Years of education: Not on file  ? Highest education level: Not on file  ?Occupational History  ? Not on file  ?Tobacco Use  ? Smoking status: Never  ?  Passive exposure: Never  ? Smokeless tobacco: Never  ?Substance and Sexual Activity  ? Alcohol use: Not Currently  ? Drug use: No  ? Sexual activity: Yes  ?Other Topics Concern  ? Not on file  ?Social History Narrative  ? Not on file  ? ?Social Determinants of Health  ? ?Financial Resource Strain: Not on file  ?Food Insecurity: Not on file  ?Transportation Needs: Not on file  ?Physical Activity: Not on file  ?Stress: Not on file  ?  Social Connections: Not on file  ?  ? ?Family History: ?The patient's family history includes Hypertension in his father. ?ROS:   ?Please see the history of present illness.    ?All 14 point review of systems negative except as described per history of present illness. ? ?EKGs/Labs/Other Studies Reviewed:  ?  ? ?The following studies were reviewed today: ?Coronary CT angio done in 2020 showed: ?IMPRESSION: ?1. There is normal pulmonary vein drainage into the left atrium. ?  ?2. The left atrial appendage is larg and there is no thrombus. ?  ?3. The esophagus runs on the R side the left atrium and is closest ?to the RLPV. ?  ?4.  40-50% soft plaque int he  proximal RCA and at the ostium of OM4. ?  ?5. Coronary calcium score of 0. This was 0 percentile for age and ?sex matched control. ?  ?6. Normal coronary origin with left dominance. ?  ?7. No evidence of obstructive CAD. ?  ?Chilton Siiffany Mastic, MD ? ? ?TEE done in September 2019 showed: ?Study Conclusions  ? ?- Left ventricle: The cavity size was normal. Systolic function was  ?  normal. The estimated ejection fraction was 55%. Wall motion was  ?  normal; there were no regional wall motion abnormalities. No  ?  evidence of thrombus.  ?- Aortic valve: There was no stenosis.  ?- Aorta: Normal caliber aorta with minimal plaque.  ?- Mitral valve: There was mild to moderate regurgitation.  ?- Left atrium: The atrium was mildly dilated. No evidence of  ?  thrombus in the atrial cavity or appendage. No evidence of  ?  thrombus in the atrial cavity or appendage.  ?- Right ventricle: The cavity size was normal. Systolic function  ?  was normal.  ?- Right atrium: No evidence of thrombus in the atrial cavity or  ?  appendage.  ?  ? ?EKG:  EKG is  ordered today.  The ekg ordered today demonstrates normal sinus rhythm normal P interval normal QS complex duration fulgent no ST segment changes ? ?Recent Labs: ?09/23/2021: BUN 15; Creatinine, Ser 0.85; Hemoglobin 15.3; Platelets 179.0; Potassium 3.8; Pro B Natriuretic peptide (BNP) 22.0; Sodium 135 ?09/29/2021: ALT 41; TSH 1.240  ?Recent Lipid Panel ?   ?Component Value Date/Time  ? CHOL 224 (H) 09/29/2021 1021  ? TRIG 115 09/29/2021 1021  ? HDL 31 (L) 09/29/2021 1021  ? CHOLHDL 7.2 (H) 09/29/2021 1021  ? LDLCALC 172 (H) 09/29/2021 1021  ? ? ?Physical Exam:   ? ?VS:  BP (!) 168/100 (BP Location: Right Arm, Patient Position: Sitting)   Pulse 75   Ht 5\' 8"  (1.727 m)   Wt 212 lb (96.2 kg)   SpO2 97%   BMI 32.23 kg/m?    ? ?Wt Readings from Last 3 Encounters:  ?02/05/22 212 lb (96.2 kg)  ?01/12/22 214 lb 9.6 oz (97.3 kg)  ?09/29/21 217 lb (98.4 kg)  ?  ? ?GEN:  Well nourished, well  developed in no acute distress ?HEENT: Normal ?NECK: No JVD; No carotid bruits ?LYMPHATICS: No lymphadenopathy ?CARDIAC: RRR, no murmurs, no rubs, no gallops ?RESPIRATORY:  Clear to auscultation without rales, wheezing or rhonchi  ?ABDOMEN: Soft, non-tender, non-distended ?MUSCULOSKELETAL:  No edema; No deformity  ?SKIN: Warm and dry ?NEUROLOGIC:  Alert and oriented x 3 ?PSYCHIATRIC:  Normal affect  ? ?ASSESSMENT:   ? ?1. Typical atrial flutter (HCC)   ?2. Primary hypertension   ?3. Type 2 diabetes mellitus without complication, without long-term  current use of insulin (HCC)   ?4. Mixed hyperlipidemia   ? ?PLAN:   ? ?In order of problems listed above: ? ?History of typical atrial flutter.  Does not have anymore.  He felt clearly when he had episode does not have anymore since that time.  He is not anticoagulated. ?Essential hypertension which is uncontrolled.  I asked him to have Chem-7 done today, will double the dose of Cozaar to 50 mg daily, I asked him to check his blood pressure on the regular basis and sent to me report in about 1 week.  I see him back in my office in about 2 months but hopefully will be able to change his medication and have adjusted medication over the phone.  Type 2 diabetes that being followed by internal medicine team.  His hemoglobin A1c is 6.4 this is from February of this year.  We will continue present management. ?Dyslipidemia I do have his fasting lipid profile from November which show LDL 172 and HDL 31.  He does have already proven coronary artery disease likely nonobstructive from 2019.  We need to make sure he takes his medication I also asked him to start taking 1 baby aspirin every single day.  We will check his cholesterol today. ? ? ? ?Medication Adjustments/Labs and Tests Ordered: ?Current medicines are reviewed at length with the patient today.  Concerns regarding medicines are outlined above.  ?No orders of the defined types were placed in this encounter. ? ?No orders of  the defined types were placed in this encounter. ? ? ?Signed, ?Georgeanna Lea, MD, [facility]. ?02/05/2022 3:11 PM    ?[facility] ?

## 2022-02-05 NOTE — Addendum Note (Signed)
Addended by: Roxanne Mins I on: 02/05/2022 03:25 PM ? ? Modules accepted: Orders ? ?

## 2022-02-05 NOTE — Patient Instructions (Signed)
Medication Instructions:  ?Your physician has recommended you make the following change in your medication:  ? ?START:Losartan 50 mg daily ?START: Aspirin 81 mg daily ? ?*If you need a refill on your cardiac medications before your next appointment, please call your pharmacy* ? ? ?Lab Work: ?Your physician recommends that you return for lab work in:  ? ?Labs today: Direct LDL, BMP ? ?If you have labs (blood work) drawn today and your tests are completely normal, you will receive your results only by: ?MyChart Message (if you have MyChart) OR ?A paper copy in the mail ?If you have any lab test that is abnormal or we need to change your treatment, we will call you to review the results. ? ? ?Testing/Procedures: ?None ? ? ?Follow-Up: ?At [facility], you and your health needs are our priority.  As part of our continuing mission to provide you with exceptional heart care, we have created designated Provider Care Teams.  These Care Teams include your primary Cardiologist (physician) and Advanced Practice Providers (APPs -  Physician Assistants and Nurse Practitioners) who all work together to provide you with the care you need, when you need it. ? ?We recommend signing up for the patient portal called "MyChart".  Sign up information is provided on this After Visit Summary.  MyChart is used to connect with patients for Virtual Visits (Telemedicine).  Patients are able to view lab/test results, encounter notes, upcoming appointments, etc.  Non-urgent messages can be sent to your provider as well.   ?To learn more about what you can do with MyChart, go to [email or web address].   ? ?Your next appointment:   ?2 month(s) ? ?The format for your next appointment:   ?In Person ? ?Provider:   ?Gypsy Balsam, MD  ? ? ?Other Instructions ?Please take blood pressures for 1 week and e-mail to [email or web address]. ? ?

## 2022-04-06 ENCOUNTER — Ambulatory Visit: Payer: 59 | Admitting: Cardiology

## 2022-04-06 ENCOUNTER — Encounter: Payer: Self-pay | Admitting: Cardiology

## 2022-04-06 VITALS — BP 146/102 | HR 68 | Ht 68.0 in | Wt 210.0 lb

## 2022-04-06 DIAGNOSIS — I10 Essential (primary) hypertension: Secondary | ICD-10-CM | POA: Diagnosis not present

## 2022-04-06 DIAGNOSIS — E119 Type 2 diabetes mellitus without complications: Secondary | ICD-10-CM

## 2022-04-06 DIAGNOSIS — E782 Mixed hyperlipidemia: Secondary | ICD-10-CM | POA: Diagnosis not present

## 2022-04-06 DIAGNOSIS — I483 Typical atrial flutter: Secondary | ICD-10-CM

## 2022-04-06 MED ORDER — AMLODIPINE BESYLATE 5 MG PO TABS: 5.0000 mg | ORAL_TABLET | Freq: Every day | ORAL | 0 refills | Status: DC

## 2022-04-06 NOTE — Progress Notes (Signed)
Cardiology Office Note:    Date:  04/06/2022   ID:  [place], [place] [birth year], MRN [id number]  PCP:  Rema Fendt, NP  Cardiologist:  Gypsy Balsam, MD    Referring MD: Rema Fendt, NP   Chief Complaint  Patient presents with   Hypertension  Doing well blood pressure still high  History of Present Illness:    Kenneth Marsh is a 51 y.o. male with past medical history significant for paroxysmal atrial typical atrial flutter, only 1 documented episode, not anticoagulated CHA2DS2-VASc equals 1 also essential hypertension still seems to be a little difficult to control, dyslipidemia, he comes today to my office for follow-up overall doing very well.  Still working [employer] business and have no difficulty doing it he denies have any chest pain tightness squeezing pressure burning chest.  Essential  Past Medical History:  Diagnosis Date   Atrial flutter (HCC)    Diabetes mellitus, type 2 (HCC) 09/30/2021   Hyperlipidemia 09/30/2021   Primary hypertension 09/29/2021    Past Surgical History:  Procedure Laterality Date   CARDIOVERSION N/A 08/07/2018   Procedure: CARDIOVERSION;  Surgeon: Laurey Morale, MD;  Location: [facility] ENDOSCOPY;  Service: Cardiovascular;  Laterality: N/A;   TEE WITHOUT CARDIOVERSION N/A 08/07/2018   Procedure: TRANSESOPHAGEAL ECHOCARDIOGRAM (TEE);  Surgeon: Laurey Morale, MD;  Location: [facility] ENDOSCOPY;  Service: Cardiovascular;  Laterality: N/A;    Current Medications: Current Meds  Medication Sig   albuterol (VENTOLIN HFA) 108 (90 Base) MCG/ACT inhaler Inhale 2 puffs into the lungs every 6 (six) hours as needed for wheezing or shortness of breath.   atorvastatin (LIPITOR) 20 MG tablet Take 1 tablet (20 mg total) by mouth daily.   losartan (COZAAR) 50 MG tablet Take 1 tablet (50 mg total) by mouth daily.   metFORMIN (GLUCOPHAGE) 500 MG tablet Take 1 tablet (500 mg total) by mouth 2 (two) times daily with a meal.     Allergies:    Patient has no known allergies.   Social History   Socioeconomic History   Marital status: Divorced    Spouse name: Not on file   Number of children: Not on file   Years of education: Not on file   Highest education level: Not on file  Occupational History   Not on file  Tobacco Use   Smoking status: Never    Passive exposure: Never   Smokeless tobacco: Never  Substance and Sexual Activity   Alcohol use: Not Currently   Drug use: No   Sexual activity: Yes  Other Topics Concern   Not on file  Social History Narrative   Not on file   Social Determinants of Health   Financial Resource Strain: Not on file  Food Insecurity: Not on file  Transportation Needs: Not on file  Physical Activity: Not on file  Stress: Not on file  Social Connections: Not on file     Family History: The patient's family history includes Hypertension in his father. ROS:   Please see the history of present illness.    All 14 point review of systems negative except as described per history of present illness  EKGs/Labs/Other Studies Reviewed:      Recent Labs: 09/23/2021: BUN 15; Creatinine, Ser 0.85; Hemoglobin 15.3; Platelets 179.0; Potassium 3.8; Pro B Natriuretic peptide (BNP) 22.0; Sodium 135 09/29/2021: ALT 41; TSH 1.240  Recent Lipid Panel    Component Value Date/Time   CHOL 224 (H) 09/29/2021 1021   TRIG 115 09/29/2021 1021  HDL 31 (L) 09/29/2021 1021   CHOLHDL 7.2 (H) 09/29/2021 1021   LDLCALC 172 (H) 09/29/2021 1021    Physical Exam:    VS:  BP (!) 146/102 (BP Location: Right Arm, Patient Position: Sitting)   Pulse 68   Ht 5\' 8"  (1.727 m)   Wt 210 lb (95.3 kg)   SpO2 92%   BMI 31.93 kg/m     Wt Readings from Last 3 Encounters:  04/06/22 210 lb (95.3 kg)  02/05/22 212 lb (96.2 kg)  01/12/22 214 lb 9.6 oz (97.3 kg)     GEN:  Well nourished, well developed in no acute distress HEENT: Normal NECK: No JVD; No carotid bruits LYMPHATICS: No lymphadenopathy CARDIAC: RRR,  no murmurs, no rubs, no gallops RESPIRATORY:  Clear to auscultation without rales, wheezing or rhonchi  ABDOMEN: Soft, non-tender, non-distended MUSCULOSKELETAL:  No edema; No deformity  SKIN: Warm and dry LOWER EXTREMITIES: no swelling NEUROLOGIC:  Alert and oriented x 3 PSYCHIATRIC:  Normal affect   ASSESSMENT:    1. Typical atrial flutter (HCC)   2. Primary hypertension   3. Mixed hyperlipidemia   4. Type 2 diabetes mellitus without complication, without long-term current use of insulin (HCC)    PLAN:    In order of problems listed above:  Hypertension blood pressure still not well controlled.  I think he can benefit from addition of small dose of amlodipine.  I will start him 5 mg a warning about potential side effect of swelling however I do not think can happen at this small dose.  So, he will continue losartan as well as amlodipine. Dyslipidemia he is on a statin I will check his fasting lipid profile. Typical atrial flutter denies having episodes. Type 2 diabetes on Glucophage.  Last hemoglobin A1c 6.4 however that number is from February of this year   Medication Adjustments/Labs and Tests Ordered: Current medicines are reviewed at length with the patient today.  Concerns regarding medicines are outlined above.  No orders of the defined types were placed in this encounter.  Medication changes: No orders of the defined types were placed in this encounter.   Signed, March, MD, [facility] 04/06/2022 9:57 AM    [facility]

## 2022-04-06 NOTE — Addendum Note (Signed)
Addended by: Baldo Ash D on: 04/06/2022 10:13 AM   Modules accepted: Orders

## 2022-04-06 NOTE — Patient Instructions (Addendum)
Medication Instructions:  Your physician has recommended you make the following change in your medication: START AMLODIPINE 5mg  1 tablet by mouth every day    Lab Work: Lipid profile - today 2nd Floor- [address] If you have labs (blood work) drawn today and your tests are completely normal, you will receive your results only by: MyChart Message (if you have MyChart) OR A paper copy in the mail If you have any lab test that is abnormal or we need to change your treatment, we will call you to review the results.   Testing/Procedures: None Ordered   Follow-Up: At [facility], you and your health needs are our priority.  As part of our continuing mission to provide you with exceptional heart care, we have created designated Provider Care Teams.  These Care Teams include your primary Cardiologist (physician) and Advanced Practice Providers (APPs -  Physician Assistants and Nurse Practitioners) who all work together to provide you with the care you need, when you need it.  We recommend signing up for the patient portal called "MyChart".  Sign up information is provided on this After Visit Summary.  MyChart is used to connect with patients for Virtual Visits (Telemedicine).  Patients are able to view lab/test results, encounter notes, upcoming appointments, etc.  Non-urgent messages can be sent to your provider as well.   To learn more about what you can do with MyChart, go to [facility].    Your next appointment:   6 month(s)  The format for your next appointment:   In Person  Provider:   [email or web address], MD    Other Instructions NA

## 2022-04-07 LAB — LIPID PANEL
Chol/HDL Ratio: 6.1 ratio — ABNORMAL HIGH (ref 0.0–5.0)
Cholesterol, Total: 201 mg/dL — ABNORMAL HIGH (ref 100–199)
HDL: 33 mg/dL — ABNORMAL LOW (ref >39)
LDL Chol Calc (NIH): 148 mg/dL — ABNORMAL HIGH (ref 0–99)
Triglycerides: 107 mg/dL (ref 0–149)
VLDL Cholesterol Cal: 20 mg/dL (ref 5–40)

## 2022-04-07 NOTE — Progress Notes (Signed)
Erroneous encounter-disregard

## 2022-04-13 ENCOUNTER — Encounter: Payer: 59 | Admitting: Family

## 2022-04-13 DIAGNOSIS — Z789 Other specified health status: Secondary | ICD-10-CM

## 2022-04-13 DIAGNOSIS — E119 Type 2 diabetes mellitus without complications: Secondary | ICD-10-CM

## 2022-05-06 ENCOUNTER — Other Ambulatory Visit: Payer: Self-pay | Admitting: Family

## 2022-05-06 ENCOUNTER — Other Ambulatory Visit: Payer: Self-pay | Admitting: Cardiology

## 2022-05-06 DIAGNOSIS — E785 Hyperlipidemia, unspecified: Secondary | ICD-10-CM

## 2022-07-15 NOTE — Progress Notes (Unsigned)
Patient ID: Kenneth Marsh, male    DOB: Aug 31, 1971  MRN: [id number]  CC: Back Pain  Subjective: Kenneth Marsh is a 51 y.o. male who presents for back pain.   His concerns today include: ***  Patient Active Problem List   Diagnosis Date Noted   Diabetes mellitus, type 2 (HCC) 09/30/2021   Hyperlipidemia 09/30/2021   Primary hypertension 09/29/2021   Atrial flutter (HCC) 08/06/2018     Current Outpatient Medications on File Prior to Visit  Medication Sig Dispense Refill   albuterol (VENTOLIN HFA) 108 (90 Base) MCG/ACT inhaler Inhale 2 puffs into the lungs every 6 (six) hours as needed for wheezing or shortness of breath. 16 g 1   amLODipine (NORVASC) 5 MG tablet Take 1 tablet by mouth once daily 90 tablet 2   aspirin EC 81 MG tablet Take 1 tablet (81 mg total) by mouth daily. Swallow whole. (Patient not taking: Reported on 04/06/2022) 90 tablet 3   atorvastatin (LIPITOR) 20 MG tablet Take 1 tablet by mouth once daily 120 tablet 0   losartan (COZAAR) 50 MG tablet Take 1 tablet (50 mg total) by mouth daily. 90 tablet 3   metFORMIN (GLUCOPHAGE) 500 MG tablet Take 1 tablet (500 mg total) by mouth 2 (two) times daily with a meal. 180 tablet 0   No current facility-administered medications on file prior to visit.    No Known Allergies  Social History   Socioeconomic History   Marital status: Divorced    Spouse name: Not on file   Number of children: Not on file   Years of education: Not on file   Highest education level: Not on file  Occupational History   Not on file  Tobacco Use   Smoking status: Never    Passive exposure: Never   Smokeless tobacco: Never  Substance and Sexual Activity   Alcohol use: Not Currently   Drug use: No   Sexual activity: Yes  Other Topics Concern   Not on file  Social History Narrative   Not on file   Social Determinants of Health   Financial Resource Strain: Not on file  Food Insecurity: Not on file  Transportation Needs: Not on  file  Physical Activity: Not on file  Stress: Not on file  Social Connections: Not on file  Intimate Partner Violence: Not on file    Family History  Problem Relation Age of Onset   Hypertension Father     Past Surgical History:  Procedure Laterality Date   CARDIOVERSION N/A 08/07/2018   Procedure: CARDIOVERSION;  Surgeon: Laurey Morale, MD;  Location: [facility] ENDOSCOPY;  Service: Cardiovascular;  Laterality: N/A;   TEE WITHOUT CARDIOVERSION N/A 08/07/2018   Procedure: TRANSESOPHAGEAL ECHOCARDIOGRAM (TEE);  Surgeon: Laurey Morale, MD;  Location: [facility] ENDOSCOPY;  Service: Cardiovascular;  Laterality: N/A;    ROS: Review of Systems Negative except as stated above  PHYSICAL EXAM: There were no vitals taken for this visit.  Physical Exam  {male adult master:310786} {male adult master:310785}     Latest Ref Rng & Units 09/29/2021   10:21 AM 09/23/2021   11:51 AM 12/29/2018    9:59 AM  CMP  Glucose 70 - 99 mg/dL  433  295   BUN 6 - 23 mg/dL  15  12   Creatinine 1.88 - 1.50 mg/dL  4.16  6.06   Sodium 301 - 145 mEq/L  135  138   Potassium 3.5 - 5.1 mEq/L  3.8  4.1  Chloride 96 - 112 mEq/L  104  102   CO2 19 - 32 mEq/L  26  20   Calcium 8.4 - 10.5 mg/dL  9.5  9.2   Total Protein 6.0 - 8.5 g/dL 7.6     Total Bilirubin 0.0 - 1.2 mg/dL 0.5     Alkaline Phos 44 - 121 IU/L 91     AST 0 - 40 IU/L 24     ALT 0 - 44 IU/L 41      Lipid Panel     Component Value Date/Time   CHOL 201 (H) 04/06/2022 1023   TRIG 107 04/06/2022 1023   HDL 33 (L) 04/06/2022 1023   CHOLHDL 6.1 (H) 04/06/2022 1023   LDLCALC 148 (H) 04/06/2022 1023    CBC    Component Value Date/Time   WBC 7.1 09/23/2021 1151   RBC 5.14 09/23/2021 1151   HGB 15.3 09/23/2021 1151   HCT 44.7 09/23/2021 1151   PLT 179.0 09/23/2021 1151   MCV 86.8 09/23/2021 1151   MCH 29.5 08/07/2018 0634   MCHC 34.2 09/23/2021 1151   RDW 13.5 09/23/2021 1151   LYMPHSABS 3.0 09/23/2021 1151   MONOABS 0.6 09/23/2021 1151    EOSABS 0.2 09/23/2021 1151   BASOSABS 0.0 09/23/2021 1151    ASSESSMENT AND PLAN:  There are no diagnoses linked to this encounter.   Patient was given the opportunity to ask questions.  Patient verbalized understanding of the plan and was able to repeat key elements of the plan. Patient was given clear instructions to go to Emergency Department or return to medical center if symptoms don't improve, worsen, or new problems develop.The patient verbalized understanding.   No orders of the defined types were placed in this encounter.    Requested Prescriptions    No prescriptions requested or ordered in this encounter    No follow-ups on file.  Rema Fendt, NP

## 2022-07-18 ENCOUNTER — Telehealth: Payer: Self-pay | Admitting: Internal Medicine

## 2022-07-18 NOTE — Telephone Encounter (Signed)
       Re Kenneth Marsh   1971-07-11 - seen nov 2022 - RAST allergye panel neg. CT with suggestion for ILD . Was supposed to have PFT and FeNO test and return to see appp in few weeks but I DO NOT see any followup.  PLAN - pls give him repeat HRCT nov 2023, full PFT also, and feno test - all in nov 2023 and see me in 30 min slot

## 2022-07-21 ENCOUNTER — Encounter: Payer: 59 | Admitting: Family

## 2022-09-27 ENCOUNTER — Other Ambulatory Visit: Payer: Self-pay | Admitting: *Deleted

## 2022-09-27 ENCOUNTER — Ambulatory Visit: Payer: 59 | Admitting: Internal Medicine

## 2022-09-27 DIAGNOSIS — R0600 Dyspnea, unspecified: Secondary | ICD-10-CM

## 2022-09-27 NOTE — Progress Notes (Deleted)
OV 09/23/2021  Subjective:  Patient ID: Kenneth Marsh, male , DOB: 12-May-1971 , age 51 y.o. , MRN: [id number] , ADDRESS: [address] [place] [address] PCP Rema Fendt, NP Patient Care Team: Rema Fendt, NP as PCP - General (Nurse Practitioner) Regan Lemming, MD as PCP - Electrophysiology (Cardiology)  This Provider for this visit: Treatment Team:  Attending Provider: Kalman Shan, MD    09/23/2021 -   Chief Complaint  Patient presents with   Consult    Pt states after a recent cxr a spot was seen. Pt does have some occ SOB at night when he goes to sleep. States that he does that he does wheeze at times when he sleeps. Has an occ cough at night.     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  He is not a smoker.  In 2019 he got admitted for atrial flutter that is presumably due to caffeine intake.  He is not on any anticoagulation.  He is cardioverted.  He follows with cardiology and is believed to be in sinus.  I reviewed the notes.  He says that he has intermittent chronic wheezing since 2014 particularly at night when he lies on his side.  It is slowly getting worse particularly in the year 2022.  Also the year 2022 he said insidious onset of cough particularly at night when he lies on his side and also shortness of breath at the same time.  The shortness of breath is definitely not exertional.  There is no paroxysmal nocturnal dyspnea.  He climbs stairs and goes of the roof he does not feel short of breath.  He believes he does snore and there might be excessive daytime somnolence.  There is history of possible hypertension recently during his annual physical he did have chest x-ray this reported as ILD changes.  I personally visualized this am not sure.  Previous eosinophils slightly high.  He again categorically tells me that he does not have exertional edema cough.   It is all positional and at night.     Sept 2019  Results for Kenneth, Marsh (MRN [id number]) as of 09/23/2021 11:25  Ref. Range 08/06/2018 06:50  Eosinophils Absolute Latest Ref Range: 0.0 - 0.7 K/uL 0.2    Results for Kenneth, Marsh (MRN [id number]) as of 09/23/2021 11:25  Ref. Range 08/06/2018 06:50 08/06/2018 08:34  Alcohol, Ethyl (B) Latest Ref Range: <10 mg/dL <93   Amphetamines Latest Ref Range: NONE DETECTED   NONE DETECTED  Barbiturates Latest Ref Range: NONE DETECTED   NONE DETECTED  Benzodiazepines Latest Ref Range: NONE DETECTED   NONE DETECTED  Opiates Latest Ref Range: NONE DETECTED   NONE DETECTED  COCAINE Latest Ref Range: NONE DETECTED   NONE DETECTED  Tetrahydrocannabinol Latest Ref Range: NONE DETECTED   NONE DETECTED    ECHO  2019 Study Conclusions   - Left ventricle: The cavity size was normal. Systolic function was    normal. The estimated ejection fraction was 55%. Wall motion was    normal; there were no regional wall motion abnormalities. No    evidence of thrombus.  - Aortic valve: There was no stenosis.  - Aorta: Normal caliber aorta with minimal plaque.  - Mitral valve: There was mild to moderate regurgitation.  - Left atrium: The atrium was mildly dilated. No evidence of  thrombus in the atrial cavity or appendage. No evidence of    thrombus in the atrial cavity or appendage.  - Right ventricle: The cavity size was normal. Systolic function    was normal.  - Right atrium: No evidence of thrombus in the atrial cavity or    appendage.   Impressions:   - Successful cardioversion. No cardiac source of emboli was    indentified.     Kenneth Marsh   08/29/1971 - seen nov 2022 - RAST allergye panel neg. CT with suggestion for ILD . Was supposed to have PFT and FeNO test and return to see appp in few weeks but I DO NOT see any followup.  PLAN - pls give him repeat HRCT nov 2023, full PFT also, and feno test - all in nov 2023 and see  me in 30 min slot   OV 09/27/2022  Subjective:  Patient ID: Kenneth Marsh, male , DOB: 1970-11-24 , age 51 y.o. , MRN: [id number] , ADDRESS: [address] [place] [address] PCP Camillia Herter, NP Patient Care Team: Camillia Herter, NP as PCP - General (Nurse Practitioner) Constance Haw, MD as PCP - Electrophysiology (Cardiology)  This Provider for this visit: Treatment Team:  Attending Provider: Brand Males, MD    09/27/2022 -  No chief complaint on file.    HPI Kenneth Marsh 51 y.o. -    CT Chest data  No results found.    PFT      No data to display             has a past medical history of Atrial flutter ([place]), Diabetes mellitus, type 2 ([place]) (09/30/2021), Hyperlipidemia (09/30/2021), and Primary hypertension (09/29/2021).   reports that he has never smoked. He has never been exposed to tobacco smoke. He has never used smokeless tobacco.  Past Surgical History:  Procedure Laterality Date   CARDIOVERSION N/A 08/07/2018   Procedure: CARDIOVERSION;  Surgeon: Larey Dresser, MD;  Location: [facility] ENDOSCOPY;  Service: Cardiovascular;  Laterality: N/A;   TEE WITHOUT CARDIOVERSION N/A 08/07/2018   Procedure: TRANSESOPHAGEAL ECHOCARDIOGRAM (TEE);  Surgeon: Larey Dresser, MD;  Location: [facility] ENDOSCOPY;  Service: Cardiovascular;  Laterality: N/A;    No Known Allergies  Immunization History  Administered Date(s) Administered   Influenza,inj,Quad PF,6+ Mos 08/18/2021   Moderna Sars-Covid-2 Vaccination 03/06/2020, 04/03/2020   PNEUMOCOCCAL CONJUGATE-20 09/29/2021   Tdap 01/11/2017   Zoster Recombinat (Shingrix) 09/29/2021, 01/12/2022    Family History  Problem Relation Age of Onset   Hypertension Father      Current Outpatient Medications:    albuterol (VENTOLIN HFA) 108 (90 Base) MCG/ACT inhaler, Inhale 2 puffs into the lungs every 6 (six) hours as needed for wheezing or shortness of breath., Disp: 16 g, Rfl: 1   amLODipine  (NORVASC) 5 MG tablet, Take 1 tablet by mouth once daily, Disp: 90 tablet, Rfl: 2   aspirin EC 81 MG tablet, Take 1 tablet (81 mg total) by mouth daily. Swallow whole. (Patient not taking: Reported on 04/06/2022), Disp: 90 tablet, Rfl: 3   atorvastatin (LIPITOR) 20 MG tablet, Take 1 tablet by mouth once daily, Disp: 120 tablet, Rfl: 0   losartan (COZAAR) 50 MG tablet, Take 1 tablet (50 mg total) by mouth daily., Disp: 90 tablet, Rfl: 3   metFORMIN (GLUCOPHAGE) 500 MG tablet, Take 1 tablet (500 mg total) by mouth 2 (two) times daily with a meal., Disp: 180 tablet, Rfl: 0  Objective:   There were no vitals filed for this visit.  Estimated body mass index is 31.93 kg/m as calculated from the following:   Height as of 04/06/22: 5\' 8"  (1.727 m).   Weight as of 04/06/22: 210 lb (95.3 kg).  @WEIGHTCHANGE @  There were no vitals filed for this visit.   Physical Exam  General Appearance:    Alert, cooperative, no distress, appears stated age - *** , Deconditioned looking - *** , OBESE  - ***, Sitting on Wheelchair -  ***  Head:    Normocephalic, without obvious abnormality, atraumatic  Eyes:    PERRL, conjunctiva/corneas clear,  Ears:    Normal TM's and external ear canals, both ears  Nose:   Nares normal, septum midline, mucosa normal, no drainage    or sinus tenderness. OXYGEN ON  - *** . Patient is @ ***   Throat:   Lips, mucosa, and tongue normal; teeth and gums normal. Cyanosis on lips - ***  Neck:   Supple, symmetrical, trachea midline, no adenopathy;    thyroid:  no enlargement/tenderness/nodules; no carotid   bruit or JVD  Back:     Symmetric, no curvature, ROM normal, no CVA tenderness  Lungs:     Distress - *** , Wheeze ***, Barrell Chest - ***, Purse lip breathing - ***, Crackles - ***   Chest Wall:    No tenderness or deformity.    Heart:    Regular rate and rhythm, S1 and S2 normal, no rub   or gallop, Murmur - ***  Breast Exam:    NOT DONE  Abdomen:     Soft, non-tender,  bowel sounds active all four quadrants,    no masses, no organomegaly. Visceral obesity - ***  Genitalia:   NOT DONE  Rectal:   NOT DONE  Extremities:   Extremities - normal, Has Cane - ***, Clubbing - ***, Edema - ***  Pulses:   2+ and symmetric all extremities  Skin:   Stigmata of Connective Tissue Disease - ***  Lymph nodes:   Cervical, supraclavicular, and axillary nodes normal  Psychiatric:  Neurologic:   Pleasant - ***, Anxious - ***, Flat affect - ***  CAm-ICU - neg, Alert and Oriented x 3 - yes, Moves all 4s - yes, Speech - normal, Cognition - intact    General: No distress. *** Neuro: Alert and Oriented x 3. GCS 15. Speech normal Psych: Pleasant Resp:  Barrel Chest - ***.  Wheeze - ***, Crackles - ***, No overt respiratory distress CVS: Normal heart sounds. Murmurs - *** Ext: Stigmata of Connective Tissue Disease - *** HEENT: Normal upper airway. PEERL +. No post nasal drip        Assessment:     No diagnosis found.     Plan:     There are no Patient Instructions on file for this visit.    SIGNATURE    Dr. 04/08/22, M.D., F.C.C.P,  Pulmonary and Critical Care Medicine Staff Physician, [facility] Center Director - Interstitial Lung Disease  Program  Pulmonary Fibrosis [facility] Network at [facility], [facility], [place]  Pager: [phone], If no answer or between  15:00h - 7:00h: call [phone] Telephone: [phone]  10:33 AM 09/27/2022

## 2022-11-24 ENCOUNTER — Ambulatory Visit: Payer: Self-pay | Attending: Cardiology | Admitting: Cardiology

## 2023-02-07 NOTE — Progress Notes (Signed)
Erroneous encounter-disregard

## 2023-02-14 ENCOUNTER — Encounter: Payer: Self-pay | Admitting: Family

## 2023-02-14 DIAGNOSIS — Z1211 Encounter for screening for malignant neoplasm of colon: Secondary | ICD-10-CM

## 2023-02-14 DIAGNOSIS — E119 Type 2 diabetes mellitus without complications: Secondary | ICD-10-CM

## 2023-02-14 DIAGNOSIS — Z0000 Encounter for general adult medical examination without abnormal findings: Secondary | ICD-10-CM

## 2023-02-14 DIAGNOSIS — Z1329 Encounter for screening for other suspected endocrine disorder: Secondary | ICD-10-CM

## 2023-02-14 DIAGNOSIS — E1165 Type 2 diabetes mellitus with hyperglycemia: Secondary | ICD-10-CM

## 2023-02-14 DIAGNOSIS — Z130 Encounter for screening for diseases of the blood and blood-forming organs and certain disorders involving the immune mechanism: Secondary | ICD-10-CM

## 2023-02-14 DIAGNOSIS — Z13228 Encounter for screening for other metabolic disorders: Secondary | ICD-10-CM

## 2023-02-14 DIAGNOSIS — Z789 Other specified health status: Secondary | ICD-10-CM

## 2023-09-20 ENCOUNTER — Ambulatory Visit: Payer: Self-pay

## 2023-09-20 NOTE — Telephone Encounter (Signed)
Summary: elevated bp.   Pt and wife called in and stated pt has been experiencing high blood pressure. Stated last week, he had numbers in the 200 and 300. Stated this week, numbers have been 160 to 140. Mentioned feels tired.  FYI, NT was able to get pt scheduled in for tomorrow at 9:20 AM.  Pt stated does not need a spanish interpreter when calling back.  Seeking clinical advice.     Chief Complaint: Elevated BP. Has been out of medications x 2 months. Symptoms: Fells tired Frequency: 2 weeks Pertinent Negatives: Patient denies any other symptoms Disposition: [] ED /[] Urgent Care (no appt availability in office) / [x] Appointment(In office/virtual)/ []   Virtual Care/ [] Home Care/ [] Refused Recommended Disposition /[]  Mobile Bus/ []  Follow-up with PCP Additional Notes: Will call back for worsening of symptoms.  Reason for Disposition  Systolic BP  >= 180 OR Diastolic >= 110  Answer Assessment - Initial Assessment Questions 1. BLOOD PRESSURE: "What is the blood pressure?" "Did you take at least two measurements 5 minutes apart?"     Top number 160 140 2. ONSET: "When did you take your blood pressure?"     Yesterday 3. HOW: "How did you take your blood pressure?" (e.g., automatic home BP monitor, visiting nurse)     Home cuff 4. HISTORY: "Do you have a history of high blood pressure?"     Yes 5. MEDICINES: "Are you taking any medicines for blood pressure?" "Have you missed any doses recently?"     Out of medication x 2 months 6. OTHER SYMPTOMS: "Do you have any symptoms?" (e.g., blurred vision, chest pain, difficulty breathing, headache, weakness)     Feels tired 7. PREGNANCY: "Is there any chance you are pregnant?" "When was your last menstrual period?"     N/a  Protocols used: Blood Pressure - High-A-AH

## 2023-09-21 ENCOUNTER — Encounter: Payer: Self-pay | Admitting: Family

## 2023-09-21 ENCOUNTER — Ambulatory Visit (INDEPENDENT_AMBULATORY_CARE_PROVIDER_SITE_OTHER): Payer: BLUE CROSS/BLUE SHIELD | Admitting: Family

## 2023-09-21 VITALS — BP 150/95 | HR 74 | Temp 98.3°F | Ht 69.5 in | Wt 213.8 lb

## 2023-09-21 DIAGNOSIS — E119 Type 2 diabetes mellitus without complications: Secondary | ICD-10-CM

## 2023-09-21 DIAGNOSIS — E785 Hyperlipidemia, unspecified: Secondary | ICD-10-CM

## 2023-09-21 DIAGNOSIS — Z603 Acculturation difficulty: Secondary | ICD-10-CM

## 2023-09-21 DIAGNOSIS — I10 Essential (primary) hypertension: Secondary | ICD-10-CM | POA: Diagnosis not present

## 2023-09-21 DIAGNOSIS — Z758 Other problems related to medical facilities and other health care: Secondary | ICD-10-CM

## 2023-09-21 DIAGNOSIS — Z8639 Personal history of other endocrine, nutritional and metabolic disease: Secondary | ICD-10-CM

## 2023-09-21 DIAGNOSIS — Z8679 Personal history of other diseases of the circulatory system: Secondary | ICD-10-CM

## 2023-09-21 MED ORDER — AMLODIPINE BESYLATE 5 MG PO TABS: 5.0000 mg | ORAL_TABLET | Freq: Every day | ORAL | 0 refills | Status: DC

## 2023-09-21 MED ORDER — ATORVASTATIN CALCIUM 20 MG PO TABS: 20.0000 mg | ORAL_TABLET | Freq: Every day | ORAL | 0 refills | Status: DC

## 2023-09-21 MED ORDER — LOSARTAN POTASSIUM 50 MG PO TABS
50.0000 mg | ORAL_TABLET | Freq: Every day | ORAL | 0 refills | Status: DC
Start: 2023-09-21 — End: 2024-06-27

## 2023-09-21 NOTE — Progress Notes (Signed)
Patient states his blood pressure goes from high to low on different days.  Patient states back pain.   Patient wants cholesterol blood work done.   Patient wants Flu vaccine.

## 2023-09-21 NOTE — Progress Notes (Signed)
Patient ID: Kenneth Marsh, male    DOB: [birth year]  MRN: [id number]  CC: Blood Pressure Check  Subjective: Kenneth Marsh is a 52 y.o. male who presents for blood pressure check. He is accompanied by his wife.   His concerns today include:  09/20/2023 per triage RN call note: Summary: elevated bp.     Pt and wife called in and stated pt has been experiencing high blood pressure. Stated last week, he had numbers in the 200 and 300. Stated this week, numbers have been 160 to 140. Mentioned feels tired.  FYI, NT was able to get pt scheduled in for tomorrow at 9:20 AM.  Pt stated does not need a spanish interpreter when calling back.  Seeking clinical advice.      Chief Complaint: Elevated BP. Has been out of medications x 2 months. Symptoms: Fells tired Frequency: 2 weeks Pertinent Negatives: Patient denies any other symptoms Disposition: [] ED /[] Urgent Care (no appt availability in office) / [x] Appointment(In office/virtual)/ []  [place] Virtual Care/ [] Home Care/ [] Refused Recommended Disposition /[] [place] Mobile Bus/ []  Follow-up with PCP Additional Notes: Will call back for worsening of symptoms.  Reason for Disposition  Systolic BP  >= 180 OR Diastolic >= 110  Answer Assessment - Initial Assessment Questions 1. BLOOD PRESSURE: "What is the blood pressure?" "Did you take at least two measurements 5 minutes apart?"     Top number 160 140 2. ONSET: "When did you take your blood pressure?"     Yesterday 3. HOW: "How did you take your blood pressure?" (e.g., automatic home BP monitor, visiting nurse)     Home cuff 4. HISTORY: "Do you have a history of high blood pressure?"     Yes 5. MEDICINES: "Are you taking any medicines for blood pressure?" "Have you missed any doses recently?"     Out of medication x 2 months 6. OTHER SYMPTOMS: "Do you have any symptoms?" (e.g., blurred vision, chest pain, difficulty breathing, headache, weakness)     Feels tired 7.  PREGNANCY: "Is there any chance you are pregnant?" "When was your last menstrual period?"     N/a  Protocols used: Blood Pressure - High-A-AH  Today's office visit 09/21/2023: Patient presents today for blood pressure check. His last office visit at [facility] was on 01/12/2022 for chronic conditions. Also, patient's last office visit at Cardiology was on 04/06/2022 for chronic conditions. States he has not followed up with Cardiology since then because "they didn't schedule a follow-up appointment". Today reports he has not taken Losartan, Amlodipine, and Atorvastatin in 1 month due to needing refills. He does not complain of red flag symptoms such as but not limited to chest pain, shortness of breath, worst headache of life, nausea/vomiting.    Patient Active Problem List   Diagnosis Date Noted   Diabetes mellitus, type 2 (HCC) 09/30/2021   Hyperlipidemia 09/30/2021   Primary hypertension 09/29/2021   Atrial flutter ([facility]) 08/06/2018     Current Outpatient Medications on File Prior to Visit  Medication Sig Dispense Refill   albuterol (VENTOLIN HFA) 108 (90 Base) MCG/ACT inhaler Inhale 2 puffs into the lungs every 6 (six) hours as needed for wheezing or shortness of breath. 16 g 1   aspirin EC 81 MG tablet Take 1 tablet (81 mg total) by mouth daily. Swallow whole. 90 tablet 3   No current facility-administered medications on file prior to visit.    No Known Allergies  Social History   Socioeconomic History  Marital status: Divorced    Spouse name: Not on file   Number of children: Not on file   Years of education: Not on file   Highest education level: Not on file  Occupational History   Not on file  Tobacco Use   Smoking status: Never    Passive exposure: Never   Smokeless tobacco: Never  Substance and Sexual Activity   Alcohol use: Not Currently   Drug use: No   Sexual activity: Yes  Other Topics Concern   Not on file  Social History Narrative   Not on file    Social Determinants of Health   Financial Resource Strain: Not on File (03/11/2020)   Received from [employer], [employer]    Financial Resource Strain: 0  Food Insecurity: Not on File (03/11/2020)   Received from [place], [place]   Food Insecurity    Food: 0  Transportation Needs: Not on File (03/11/2020)   Received from [employer], [employer] Needs    Transportation: 0  Physical Activity: Not on File (03/11/2020)   Received from [place], [place]   Physical Activity    Physical Activity: 0  Stress: Not on File (03/11/2020)   Received from [place], [place]   Stress    Stress: 0  Social Connections: Unknown (03/25/2022)   Received from [facility], [facility]   Social Network    Social Network: Not on file  Intimate Partner Violence: Unknown (02/15/2022)   Received from [facility], [facility]   HITS    Physically Hurt: Not on file    Insult or Talk Down To: Not on file    Threaten Physical Harm: Not on file    Scream or Curse: Not on file    Family History  Problem Relation Age of Onset   Hypertension Father     Past Surgical History:  Procedure Laterality Date   CARDIOVERSION N/A 08/07/2018   Procedure: CARDIOVERSION;  Surgeon: Laurey Morale, MD;  Location: [facility] ENDOSCOPY;  Service: Cardiovascular;  Laterality: N/A;   TEE WITHOUT CARDIOVERSION N/A 08/07/2018   Procedure: TRANSESOPHAGEAL ECHOCARDIOGRAM (TEE);  Surgeon: Laurey Morale, MD;  Location: [facility] ENDOSCOPY;  Service: Cardiovascular;  Laterality: N/A;    ROS: Review of Systems Negative except as stated above  PHYSICAL EXAM: BP (!) 150/95   Pulse 74   Temp 98.3 F (36.8 C) (Oral)   Ht 5' 9.5" (1.765 m)   Wt 213 lb 12.8 oz (97 kg)   SpO2 95%   BMI 31.12 kg/m   Physical Exam HENT:     Head: Normocephalic and atraumatic.     Nose: Nose normal.     Mouth/Throat:     Mouth: Mucous membranes are moist.     Pharynx: Oropharynx is clear.  Eyes:     Extraocular Movements:  Extraocular movements intact.     Conjunctiva/sclera: Conjunctivae normal.     Pupils: Pupils are equal, round, and reactive to light.  Cardiovascular:     Rate and Rhythm: Normal rate and regular rhythm.     Pulses: Normal pulses.     Heart sounds: Normal heart sounds.  Pulmonary:     Effort: Pulmonary effort is normal.     Breath sounds: Normal breath sounds.  Musculoskeletal:        General: Normal range of motion.     Cervical back: Normal range of motion and neck supple.  Neurological:     General: No focal deficit present.  Mental Status: He is alert and oriented to person, place, and time.  Psychiatric:        Mood and Affect: Mood normal.        Behavior: Behavior normal.     ASSESSMENT AND PLAN: 1. Primary hypertension 2. History of atrial flutter - Blood pressure not at goal during today's visit. Patient asymptomatic without chest pressure, chest pain, palpitations, shortness of breath, worst headache of life, and any additional red flag symptoms. - Resume Losartan and Amlodipine as prescribed.  - Routine screening.  - Counseled on blood pressure goal of less than 130/80, low-sodium, DASH diet, medication compliance, and 150 minutes of moderate intensity exercise per week as tolerated. Counseled on medication adherence and adverse effects. - Referral to Cardiology for evaluation/management. During the interim follow-up with primary provider in 1 week or sooner if needed for blood pressure check. - Basic Metabolic Panel - amLODipine (NORVASC) 5 MG tablet; Take 1 tablet (5 mg total) by mouth daily.  Dispense: 90 tablet; Refill: 0 - losartan (COZAAR) 50 MG tablet; Take 1 tablet (50 mg total) by mouth daily.  Dispense: 90 tablet; Refill: 0 - Ambulatory referral to Cardiology  3. Hyperlipidemia, unspecified hyperlipidemia type - Resume Atorvastatin as prescribed. Counseled on medication adherence/adverse effects.  - Routine screening.  - Referral to Cardiology for  evaluation/management.  - Follow-up as scheduled.  - Lipid panel - atorvastatin (LIPITOR) 20 MG tablet; Take 1 tablet (20 mg total) by mouth daily.  Dispense: 90 tablet; Refill: 0 - Ambulatory referral to Cardiology  4. History of type 2 diabetes mellitus - Routine screening.  - Discussed the importance of healthy eating habits, low-carbohydrate diet, low-sugar diet, and regular aerobic exercise (at least 150 minutes a week as tolerated).  - Hemoglobin A1c - Microalbumin / creatinine urine ratio  5. Diabetic eye exam [facility]) - Referral to Ophthalmology for evaluation/management.  - Ambulatory referral to Ophthalmology  6. Encounter for diabetic foot exam [facility]) - Referral to Podiatry for evaluation/management.  - Ambulatory referral to Podiatry  7. Language barrier - Patient speaking English.     Patient was given the opportunity to ask questions.  Patient verbalized understanding of the plan and was able to repeat key elements of the plan. Patient was given clear instructions to go to Emergency Department or return to medical center if symptoms don't improve, worsen, or new problems develop.The patient verbalized understanding.   Orders Placed This Encounter  Procedures   Basic Metabolic Panel   Hemoglobin A1c   Microalbumin / creatinine urine ratio   Lipid panel   Ambulatory referral to Podiatry   Ambulatory referral to Ophthalmology   Ambulatory referral to Cardiology     Requested Prescriptions   Signed Prescriptions Disp Refills   amLODipine (NORVASC) 5 MG tablet 90 tablet 0    Sig: Take 1 tablet (5 mg total) by mouth daily.   atorvastatin (LIPITOR) 20 MG tablet 90 tablet 0    Sig: Take 1 tablet (20 mg total) by mouth daily.   losartan (COZAAR) 50 MG tablet 90 tablet 0    Sig: Take 1 tablet (50 mg total) by mouth daily.    Return in about 1 week (around 09/28/2023) for Follow-Up or next available blood pressure check.  Rema Fendt, NP

## 2023-09-22 LAB — BASIC METABOLIC PANEL
BUN/Creatinine Ratio: 17 (ref 9–20)
BUN: 13 mg/dL (ref 6–24)
CO2: 20 mmol/L (ref 20–29)
Calcium: 9.4 mg/dL (ref 8.7–10.2)
Chloride: 102 mmol/L (ref 96–106)
Creatinine, Ser: 0.78 mg/dL (ref 0.76–1.27)
Glucose: 120 mg/dL — ABNORMAL HIGH (ref 70–99)
Potassium: 4.1 mmol/L (ref 3.5–5.2)
Sodium: 136 mmol/L (ref 134–144)
eGFR: 106 mL/min/{1.73_m2} (ref >59)

## 2023-09-22 LAB — HEMOGLOBIN A1C
Est. average glucose Bld gHb Est-mCnc: 146 mg/dL
Hgb A1c MFr Bld: 6.7 % — ABNORMAL HIGH (ref 4.8–5.6)

## 2023-09-22 LAB — LIPID PANEL
Chol/HDL Ratio: 7.2 ratio — ABNORMAL HIGH (ref 0.0–5.0)
Cholesterol, Total: 223 mg/dL — ABNORMAL HIGH (ref 100–199)
HDL: 31 mg/dL — ABNORMAL LOW (ref >39)
LDL Chol Calc (NIH): 168 mg/dL — ABNORMAL HIGH (ref 0–99)
Triglycerides: 132 mg/dL (ref 0–149)
VLDL Cholesterol Cal: 24 mg/dL (ref 5–40)

## 2023-09-22 LAB — MICROALBUMIN / CREATININE URINE RATIO
Creatinine, Urine: 193.9 mg/dL
Microalb/Creat Ratio: 6 mg/g{creat} (ref 0–29)
Microalbumin, Urine: 10.7 ug/mL

## 2023-09-23 ENCOUNTER — Other Ambulatory Visit: Payer: Self-pay | Admitting: Family

## 2023-09-23 DIAGNOSIS — E1165 Type 2 diabetes mellitus with hyperglycemia: Secondary | ICD-10-CM

## 2023-09-23 MED ORDER — METFORMIN HCL ER 500 MG PO TB24: 500.0000 mg | ORAL_TABLET | Freq: Two times a day (BID) | ORAL | 0 refills | Status: DC

## 2023-09-28 ENCOUNTER — Encounter: Payer: BLUE CROSS/BLUE SHIELD | Admitting: Family

## 2023-09-28 NOTE — Progress Notes (Signed)
Erroneous encounter-disregard

## 2024-04-22 DIAGNOSIS — E1165 Type 2 diabetes mellitus with hyperglycemia: Secondary | ICD-10-CM | POA: Insufficient documentation

## 2024-04-22 DIAGNOSIS — I4892 Unspecified atrial flutter: Secondary | ICD-10-CM | POA: Insufficient documentation

## 2024-04-23 DIAGNOSIS — R079 Chest pain, unspecified: Secondary | ICD-10-CM | POA: Insufficient documentation

## 2024-04-23 DIAGNOSIS — E785 Hyperlipidemia, unspecified: Secondary | ICD-10-CM | POA: Insufficient documentation

## 2024-04-23 DIAGNOSIS — Z9289 Personal history of other medical treatment: Secondary | ICD-10-CM | POA: Insufficient documentation

## 2024-04-24 DIAGNOSIS — I5020 Unspecified systolic (congestive) heart failure: Secondary | ICD-10-CM | POA: Insufficient documentation

## 2024-04-25 ENCOUNTER — Telehealth: Payer: Self-pay | Admitting: *Deleted

## 2024-04-25 NOTE — Transitions of Care (Post Inpatient/ED Visit) (Signed)
 04/25/2024  Name: Kenneth Marsh MRN: [id number] DOB: [birth year]  Today's TOC FU Call Status: Today's TOC FU Call Status:: Successful TOC FU Call Completed TOC FU Call Complete Date: 04/25/24 Patient's Name and Date of Birth confirmed.  Transition Care Management Follow-up Telephone Call Date of Discharge: 04/24/24 Discharge Facility: Other [facility]) Name of Other (Non-[facility]) Discharge Facility: [facility] Type of Discharge: Inpatient Admission Primary Inpatient Discharge Diagnosis:: Atrial flutter with rapid ventricular response How have you been since you were released from the hospital?: Better Any questions or concerns?: Yes Patient Questions/Concerns:: Patient feeling slight dizziness this morning. Patient Questions/Concerns Addressed: Other: (Education provided, advised patient to check BS, reviewed discharge instructions for when to return to the ED and/or call 911)  Items Reviewed: Did you receive and understand the discharge instructions provided?: Yes Medications obtained,verified, and reconciled?: Yes (Medications Reviewed) Any new allergies since your discharge?: No Dietary orders reviewed?: Yes Type of Diet Ordered:: Heart Healthy, Carb Modified Do you have support at home?: Yes People in Home [RPT]: spouse, child(ren), dependent Name of Support/Comfort Primary Source: Wife/did not provide  Medications Reviewed Today: Medications Reviewed Today     Reviewed by Aura Leeds, RN (Registered Nurse) on 04/25/24 at 1026  Med List Status: <None>   Medication Order Taking? Sig Documenting Provider Last Dose Status Informant  albuterol  (VENTOLIN  HFA) 108 (90 Base) MCG/ACT inhaler [id number] No Inhale 2 puffs into the lungs every 6 (six) hours as needed for wheezing or shortness of breath.  Patient not taking: Reported on 04/25/2024   Senaida Dama, NP Not Taking Active   amiodarone (PACERONE) 200 MG tablet [id number] Yes Take 200 mg by mouth daily. [provider] Taking Active   amLODipine  (NORVASC ) 5 MG tablet [id number] No Take 1 tablet (5 mg total) by mouth daily.  Patient not taking: Reported on 04/25/2024   Senaida Dama, NP Not Taking Active   apixaban  (ELIQUIS ) 5 MG TABS tablet [id number] No Take 5 mg by mouth 2 (two) times daily.  Patient not taking: Reported on 04/25/2024   [provider] Not Taking Active Self           Med Note (Makaelyn Aponte A   Wed Apr 25, 2024  9:59 AM) Pharmacy did not have medication. Should be ready today  aspirin  EC 81 MG tablet [id number] No Take 1 tablet (81 mg total) by mouth daily. Swallow whole.  Patient not taking: Reported on 04/25/2024   Krasowski, Robert J, MD Not Taking Active   atorvastatin  (LIPITOR) 20 MG tablet [id number] Yes Take 1 tablet (20 mg total) by mouth daily. Senaida Dama, NP Taking Active            Med Note (Grantham Hippert A   Wed Apr 25, 2024 10:00 AM) Taking 40mg  daily  losartan  (COZAAR ) 50 MG tablet [id number] No Take 1 tablet (50 mg total) by mouth daily.  Patient not taking: Reported on 04/25/2024   Senaida Dama, NP Not Taking Active   metFORMIN  (GLUCOPHAGE -XR) 500 MG 24 hr tablet [id number] Yes Take 1 tablet (500 mg total) by mouth 2 (two) times daily with a meal. Senaida Dama, NP Taking Active   metoprolol  succinate (TOPROL -XL) 50 MG 24 hr tablet [id number] Yes Take 50 mg by mouth 2 (two) times daily. Take with or immediately following a meal. [provider] Taking Active   Med List Note Warden Ha, CPhT 07/12/14 0204): Doesn't have a pharmacy he uses regularly  Home Care and Equipment/Supplies: Were Home Health Services Ordered?: No Any new equipment or medical supplies ordered?: No  Functional Questionnaire: Do you need assistance with bathing/showering or dressing?: No Do you need assistance with meal preparation?: No Do you need assistance with eating?: No Do you have difficulty maintaining continence: No Do you need  assistance with getting out of bed/getting out of a chair/moving?: No Do you have difficulty managing or taking your medications?: No  Follow up appointments reviewed: PCP Follow-up appointment confirmed?: No (Engaged VBCI Care Guide to assist with scheduling PCP hospital follow up-scheduled on 05/07/24 at 10am) MD Provider Line Number:[phone] Given: No Specialist Hospital Follow-up appointment confirmed?: No (Referral to Cardiology placed on 04/24/24. Patient has not been contacted to schedule) Do you need transportation to your follow-up appointment?: No Do you understand care options if your condition(s) worsen?: Yes-patient verbalized understanding  SDOH Interventions Today    Flowsheet Row Most Recent Value  SDOH Interventions   Food Insecurity Interventions Intervention Not Indicated  Housing Interventions Intervention Not Indicated  Transportation Interventions Intervention Not Indicated  Utilities Interventions Intervention Not Indicated       Goals Addressed             This Visit's Progress    VBCI Transitions of Care (TOC) Care Plan       Problems:  Recent Hospitalization for treatment of Atrial flutter Knowledge Deficit Related to Atrial Flutter  Goal:  Over the next 30 days, the patient will not experience hospital readmission  Interventions:  Transitions of Care: Doctor Visits  - discussed the importance of doctor visits Arranged PCP follow-up within 12-14 days (Care Guide Scheduled) Post discharge activity limitations prescribed by provider reviewed Reviewed Signs and symptoms of infection Medication review, discussed the importance of each medication and taking as directed Fall precautions reviewed-advised patient to avoid climbing ladders or on 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, fashion/clothing) Reviewed discharge instructions with patient and discussed reasons to return to the ED and/or call 911 Discussed Cardiology referral-patient has not been contacted to schedule at  this time  Patient Self Care Activities:  Attend all scheduled provider appointments Call provider office for new concerns or questions  Take medications as prescribed   check pulse (heart) rate before taking medicine make a plan to exercise regularly make a plan to eat healthy take medicine as prescribed  Plan:  Telephone follow up appointment with care management team member scheduled for:  05/01/24 at 1pm         Arna Better RN, BSN [place]  [facility] RN Care Manager [phone]

## 2024-05-01 ENCOUNTER — Other Ambulatory Visit: Payer: Self-pay | Admitting: *Deleted

## 2024-05-01 NOTE — Transitions of Care (Post Inpatient/ED Visit) (Signed)
 Transition of Care week 2  Visit Note  05/01/2024  Name: Kenneth Marsh MRN: [id number]          DOB: September 19, 1971  Situation: Patient enrolled in [facility] 30-day program. Visit completed with Kenneth Marsh by telephone.   Background:   Initial Transition Care Management Follow-up Telephone Call    Past Medical History:  Diagnosis Date   Atrial flutter (HCC)    Diabetes mellitus, type 2 (HCC) 09/30/2021   Hyperlipidemia 09/30/2021   Primary hypertension 09/29/2021    Assessment: Patient Reported Symptoms: Cognitive Cognitive Status: Able to follow simple commands, Alert and oriented to person, place, and time, Normal speech and language skills, Insightful and able to interpret abstract concepts      Neurological Neurological Review of Symptoms: No symptoms reported    HEENT HEENT Symptoms Reported: No symptoms reported      Cardiovascular Cardiovascular Symptoms Reported: No symptoms reported Cardiovascular Self-Management Outcome: 4 (good) Cardiovascular Comment: BP today 124/88, pulse 62  Respiratory Respiratory Symptoms Reported: No symptoms reported    Endocrine Patient reports the following symptoms related to hypoglycemia or hyperglycemia : No symptoms reported Is patient diabetic?: Yes Is patient checking blood sugars at home?: No Endocrine Conditions: Diabetes Endocrine Self-Management Outcome: 4 (good)  Gastrointestinal Gastrointestinal Symptoms Reported: No symptoms reported      Genitourinary Genitourinary Symptoms Reported: No symptoms reported    Integumentary Integumentary Symptoms Reported: No symptoms reported    Musculoskeletal Musculoskelatal Symptoms Reviewed: No symptoms reported        Psychosocial Psychosocial Symptoms Reported: Not assessed         Vitals:   05/01/24 1324  BP: 124/88  Pulse: 62    Medications Reviewed Today     Reviewed by Aura Leeds, RN (Registered Nurse) on 05/01/24 at 1315  Med List Status: <None>    Medication Order Taking? Sig Documenting Provider Last Dose Status Informant  albuterol  (VENTOLIN  HFA) 108 (90 Base) MCG/ACT inhaler [id number]  Inhale 2 puffs into the lungs every 6 (six) hours as needed for wheezing or shortness of breath.  Patient not taking: Reported on 05/01/2024   Senaida Dama, NP  Active   amiodarone (PACERONE) 200 MG tablet [id number] Yes Take 200 mg by mouth daily. [provider]  Active   amLODipine  (NORVASC ) 5 MG tablet [id number]  Take 1 tablet (5 mg total) by mouth daily.  Patient not taking: Reported on 05/01/2024   Senaida Dama, NP  Active   apixaban  (ELIQUIS ) 5 MG TABS tablet [id number] Yes Take 5 mg by mouth 2 (two) times daily. [provider]  Active Self           Med Note (Varian Innes A   Wed Apr 25, 2024  9:59 AM) Pharmacy did not have medication. Should be ready today  aspirin  EC 81 MG tablet [id number]  Take 1 tablet (81 mg total) by mouth daily. Swallow whole.  Patient not taking: Reported on 05/01/2024   Krasowski, Robert J, MD  Active   atorvastatin  (LIPITOR) 20 MG tablet [id number]  Take 1 tablet (20 mg total) by mouth daily.  Patient not taking: Reported on 05/01/2024   Senaida Dama, NP  Active            Med Note (Teleshia Lemere A   Wed Apr 25, 2024 10:00 AM) Taking 40mg  daily  losartan  (COZAAR ) 50 MG tablet [id number]  Take 1 tablet (50 mg total) by mouth daily.  Patient not taking: Reported on 05/01/2024  Senaida Dama, NP  Active   metFORMIN  (GLUCOPHAGE -XR) 500 MG 24 hr tablet [id number] Yes Take 1 tablet (500 mg total) by mouth 2 (two) times daily with a meal. Senaida Dama, NP  Active   metoprolol  succinate (TOPROL -XL) 50 MG 24 hr tablet [id number] Yes Take 50 mg by mouth 2 (two) times daily. Take with or immediately following a meal. [provider]  Active   Med List Note Warden Ha, CPhT 07/12/14 0204): Doesn't have a pharmacy he uses regularly             Recommendation:   none  Follow  Up Plan:   Telephone follow-up in 1 week  Arna Better RN, BSN [place]  [facility] RN Care Manager [phone]

## 2024-05-01 NOTE — Patient Instructions (Signed)
 Visit Information  Thank you for taking time to visit with me today. Please don't hesitate to contact me if I can be of assistance to you before our next scheduled telephone appointment.   Following is a copy of your care plan:   Goals Addressed             This Visit's Progress    VBCI Transitions of Care (TOC) Care Plan       Problems:  Recent Hospitalization for treatment of Atrial flutter Knowledge Deficit Related to Atrial Flutter  Goal:  Over the next 30 days, the patient will not experience hospital readmission  Interventions:  Transitions of Care: Doctor Visits  - discussed the importance of doctor visits Post discharge activity limitations prescribed by provider reviewed Reviewed Signs and symptoms of infection Medication review, reviewed discharge instructions, explained patient is to continue taking atorvastatin  Discussed Cardiology referral-patient has scheduled visit 06/27/24 Reviewed upcoming PCP hospital follow up visit, advised patient to take all medications to this appointment  Patient Self Care Activities:  Attend all scheduled provider appointments Call provider office for new concerns or questions  Take medications as prescribed   check pulse (heart) rate before taking medicine make a plan to exercise regularly make a plan to eat healthy take medicine as prescribed  Plan:  Telephone follow up appointment with care management team member scheduled for:  05/09/24 at 10:30am        Patient verbalizes understanding of instructions and care plan provided today and agrees to view in MyChart. Active MyChart status and patient understanding of how to access instructions and care plan via MyChart confirmed with patient.     Telephone follow up appointment with care management team member scheduled for:05/09/24 at 10:30am  Please call the care guide team at [phone] if you need to cancel or reschedule your appointment.   Please call [phone]-TALK (toll  free, 24 hour hotline) go to [facility] [address], [place] [phone]) call 911 if you are experiencing a Mental Health or Behavioral Health Crisis or need someone to talk to.  Arna Better RN, BSN [place]  [facility] RN Care Manager [phone]

## 2024-05-07 ENCOUNTER — Encounter: Payer: Self-pay | Admitting: Family

## 2024-05-07 ENCOUNTER — Ambulatory Visit (INDEPENDENT_AMBULATORY_CARE_PROVIDER_SITE_OTHER): Payer: Self-pay | Admitting: Family

## 2024-05-07 ENCOUNTER — Inpatient Hospital Stay: Admitting: Family

## 2024-05-07 ENCOUNTER — Ambulatory Visit: Payer: Self-pay | Admitting: Family

## 2024-05-07 VITALS — BP 133/84 | HR 54 | Temp 98.5°F | Resp 16 | Ht 68.0 in | Wt 213.6 lb

## 2024-05-07 DIAGNOSIS — E785 Hyperlipidemia, unspecified: Secondary | ICD-10-CM

## 2024-05-07 DIAGNOSIS — I4891 Unspecified atrial fibrillation: Secondary | ICD-10-CM | POA: Diagnosis not present

## 2024-05-07 DIAGNOSIS — I509 Heart failure, unspecified: Secondary | ICD-10-CM

## 2024-05-07 DIAGNOSIS — E1165 Type 2 diabetes mellitus with hyperglycemia: Secondary | ICD-10-CM

## 2024-05-07 DIAGNOSIS — Z7984 Long term (current) use of oral hypoglycemic drugs: Secondary | ICD-10-CM

## 2024-05-07 DIAGNOSIS — Z09 Encounter for follow-up examination after completed treatment for conditions other than malignant neoplasm: Secondary | ICD-10-CM | POA: Diagnosis not present

## 2024-05-07 DIAGNOSIS — I4892 Unspecified atrial flutter: Secondary | ICD-10-CM | POA: Diagnosis not present

## 2024-05-07 DIAGNOSIS — Z603 Acculturation difficulty: Secondary | ICD-10-CM

## 2024-05-07 DIAGNOSIS — E119 Type 2 diabetes mellitus without complications: Secondary | ICD-10-CM

## 2024-05-07 LAB — POCT GLYCOSYLATED HEMOGLOBIN (HGB A1C): Hemoglobin A1C: 6.5 % — AB (ref 4.0–5.6)

## 2024-05-07 MED ORDER — METFORMIN HCL ER 500 MG PO TB24: 500.0000 mg | ORAL_TABLET | Freq: Two times a day (BID) | ORAL | 0 refills | Status: DC

## 2024-05-07 NOTE — Progress Notes (Signed)
 Patient ID: Kenneth Marsh, male    DOB: 04-15-1971  MRN: [id number]  CC: Hospital Discharge Follow-Up  Subjective: Kenneth Marsh is a 53 y.o. male who presents for hospital discharge follow-up.   His concerns today include:  - Patient was admitted on 04/22/2024 to [facility] Intensive Care Unit and was discharged on 04/24/2024. Patient reports since then he is feeling improved. He denies red flag symptoms. He is doing well on medication regimen, no issues/concerns. States he has an upcoming appointment with Cardiology. - Doing well on Metformin , no issues/concerns. He denies red flag symptoms associated with diabetes. - Due for diabetic eye exam. - Due for diabetic foot exam. - No further issues/concerns for discussion today.   Patient Active Problem List   Diagnosis Date Noted   Diabetes mellitus, type 2 (HCC) 09/30/2021   Hyperlipidemia 09/30/2021   Primary hypertension 09/29/2021   Atrial flutter (HCC) 08/06/2018     Current Outpatient Medications on File Prior to Visit  Medication Sig Dispense Refill   apixaban  (ELIQUIS ) 5 MG TABS tablet Take 5 mg by mouth 2 (two) times daily.     metoprolol  succinate (TOPROL -XL) 50 MG 24 hr tablet Take 50 mg by mouth 2 (two) times daily. Take with or immediately following a meal.     albuterol  (VENTOLIN  HFA) 108 (90 Base) MCG/ACT inhaler Inhale 2 puffs into the lungs every 6 (six) hours as needed for wheezing or shortness of breath. (Patient not taking: Reported on 05/01/2024) 16 g 1   amiodarone (PACERONE) 200 MG tablet Take 200 mg by mouth daily.     amLODipine  (NORVASC ) 5 MG tablet Take 1 tablet (5 mg total) by mouth daily. (Patient not taking: Reported on 05/01/2024) 90 tablet 0   aspirin  EC 81 MG tablet Take 1 tablet (81 mg total) by mouth daily. Swallow whole. (Patient not taking: Reported on 05/01/2024) 90 tablet 3   atorvastatin  (LIPITOR) 20 MG tablet Take 1 tablet (20 mg total) by mouth daily. (Patient not taking: Reported on 05/01/2024) 90  tablet 0   losartan  (COZAAR ) 50 MG tablet Take 1 tablet (50 mg total) by mouth daily. (Patient not taking: Reported on 05/01/2024) 90 tablet 0   No current facility-administered medications on file prior to visit.    No Known Allergies  Social History   Socioeconomic History   Marital status: Divorced    Spouse name: Not on file   Number of children: Not on file   Years of education: Not on file   Highest education level: Not on file  Occupational History   Not on file  Tobacco Use   Smoking status: Never    Passive exposure: Never   Smokeless tobacco: Never  Vaping Use   Vaping status: Never Used  Substance and Sexual Activity   Alcohol use: Not Currently   Drug use: No   Sexual activity: Yes  Other Topics Concern   Not on file  Social History Narrative   Not on file   Social Drivers of Health   Financial Resource Strain: Low Risk  (05/07/2024)   Overall Financial Resource Strain (CARDIA)    Difficulty of Paying Living Expenses: Not hard at all  Food Insecurity: No Food Insecurity (04/25/2024)   Hunger Vital Sign    Worried About Running Out of Food in the Last Year: Never true    Ran Out of Food in the Last Year: Never true  Transportation Needs: No Transportation Needs (04/25/2024)   PRAPARE - Transportation  Lack of Transportation (Medical): No    Lack of Transportation (Non-Medical): No  Physical Activity: Inactive (05/07/2024)   Exercise Vital Sign    Days of Exercise per Week: 0 days    Minutes of Exercise per Session: 0 min  Stress: No Stress Concern Present (05/07/2024)   [employer] of Occupational Health - Occupational Stress Questionnaire    Feeling of Stress: Only a little  Social Connections: Unknown (03/25/2022)   Received from [facility]   Social Network    Social Network: Not on file  Intimate Partner Violence: Not At Risk (04/25/2024)   Humiliation, Afraid, Rape, and Kick questionnaire    Fear of Current or Ex-Partner: No    Emotionally  Abused: No    Physically Abused: No    Sexually Abused: No    Family History  Problem Relation Age of Onset   Hypertension Father     Past Surgical History:  Procedure Laterality Date   CARDIOVERSION N/A 08/07/2018   Procedure: CARDIOVERSION;  Surgeon: Rolan Ezra RAMAN, MD;  Location: [facility] ENDOSCOPY;  Service: Cardiovascular;  Laterality: N/A;   TEE WITHOUT CARDIOVERSION N/A 08/07/2018   Procedure: TRANSESOPHAGEAL ECHOCARDIOGRAM (TEE);  Surgeon: Rolan Ezra RAMAN, MD;  Location: [facility] ENDOSCOPY;  Service: Cardiovascular;  Laterality: N/A;    ROS: Review of Systems Negative except as stated above  PHYSICAL EXAM: BP 133/84   Pulse (!) 54   Temp 98.5 F (36.9 C) (Oral)   Resp 16   Ht 5' 8 (1.727 m)   Wt 213 lb 9.6 oz (96.9 kg)   SpO2 94%   BMI 32.48 kg/m    Physical Exam HENT:     Head: Normocephalic and atraumatic.     Nose: Nose normal.     Mouth/Throat:     Mouth: Mucous membranes are moist.     Pharynx: Oropharynx is clear.   Eyes:     Extraocular Movements: Extraocular movements intact.     Conjunctiva/sclera: Conjunctivae normal.     Pupils: Pupils are equal, round, and reactive to light.    Cardiovascular:     Rate and Rhythm: Bradycardia present.     Pulses: Normal pulses.     Heart sounds: Normal heart sounds.  Pulmonary:     Effort: Pulmonary effort is normal.     Breath sounds: Normal breath sounds.   Musculoskeletal:        General: Normal range of motion.     Cervical back: Normal range of motion and neck supple.   Neurological:     General: No focal deficit present.     Mental Status: He is alert and oriented to person, place, and time.   Psychiatric:        Mood and Affect: Mood normal.        Behavior: Behavior normal.     ASSESSMENT AND PLAN: 1. Hospital discharge follow-up (Primary) - Reviewed hospital course, current medications, ensured proper follow-up in place, and addressed concerns.   2. Atrial fibrillation with RVR (HCC) 3.  Paroxysmal atrial flutter (HCC) 4. Dyslipidemia 5. Heart failure, unspecified HF chronicity, unspecified heart failure type [facility]) - Patient today in office with no cardiopulmonary/acute distress.  - Continue present management. - Keep all scheduled appointments with Cardiology.  6. Type 2 diabetes mellitus with hyperglycemia, without long-term current use of insulin (HCC) - Continue Metformin  XR as prescribed.  - Hemoglobin A1c result pending.  - Discussed the importance of healthy eating habits, low-carbohydrate diet, low-sugar diet, regular aerobic exercise (at least  150 minutes a week as tolerated) and medication compliance to achieve or maintain control of diabetes. Counseled on medication adherence/adverse effects.  - Follow-up with primary provider as scheduled. - HgB A1c - metFORMIN  (GLUCOPHAGE -XR) 500 MG 24 hr tablet; Take 1 tablet (500 mg total) by mouth 2 (two) times daily with a meal.  Dispense: 180 tablet; Refill: 0  7. Diabetic eye exam [facility]) - Referral to Ophthalmology for evaluation/management. - Ambulatory referral to Ophthalmology  8. Encounter for diabetic foot exam [facility]) - Referral to Podiatry for evaluation/management. - Ambulatory referral to Podiatry  9. Language barrier - Patient speaking English.   Patient was given the opportunity to ask questions.  Patient verbalized understanding of the plan and was able to repeat key elements of the plan. Patient was given clear instructions to go to Emergency Department or return to medical center if symptoms don't improve, worsen, or new problems develop.The patient verbalized understanding.   Orders Placed This Encounter  Procedures   Ambulatory referral to Ophthalmology   Ambulatory referral to Podiatry   HgB A1c     Requested Prescriptions   Signed Prescriptions Disp Refills   metFORMIN  (GLUCOPHAGE -XR) 500 MG 24 hr tablet 180 tablet 0    Sig: Take 1 tablet (500 mg total) by mouth 2 (two) times daily with a  meal.    Return in about 3 months (around 08/07/2024) for Follow-Up or next available chronic conditions.  Greig JINNY Drones, NP

## 2024-05-08 ENCOUNTER — Telehealth: Payer: Self-pay

## 2024-05-08 NOTE — Telephone Encounter (Signed)
 Copied from CRM [id number]. Topic: Clinical - Lab/Test Results >> May 08, 2024 10:14 AM Larissa RAMAN wrote: Reason for CRM: Patient requesting lab results. Relayed information per provider's note, verbatim. Patient has additional questions and requested a callback.

## 2024-05-09 ENCOUNTER — Encounter: Payer: Self-pay | Admitting: *Deleted

## 2024-05-09 ENCOUNTER — Telehealth: Payer: Self-pay | Admitting: *Deleted

## 2024-05-10 ENCOUNTER — Telehealth: Payer: Self-pay | Admitting: *Deleted

## 2024-05-10 NOTE — Transitions of Care (Post Inpatient/ED Visit) (Signed)
 Transition of Care week 3  Visit Note  05/10/2024  Name: Kenneth Marsh MRN: [id number]          DOB: 08/12/1971  Situation: Patient enrolled in [facility] 30-day program. Visit completed with Kenneth Marsh by telephone.   Background:   Initial Transition Care Management Follow-up Telephone Call    Past Medical History:  Diagnosis Date   Atrial flutter (HCC)    Diabetes mellitus, type 2 (HCC) 09/30/2021   Hyperlipidemia 09/30/2021   Primary hypertension 09/29/2021    Assessment: Patient Reported Symptoms: Cognitive Cognitive Status: Able to follow simple commands, Alert and oriented to person, place, and time, Normal speech and language skills, Insightful and able to interpret abstract concepts      Neurological Neurological Review of Symptoms: No symptoms reported    HEENT HEENT Symptoms Reported: No symptoms reported      Cardiovascular Cardiovascular Symptoms Reported: No symptoms reported Cardiovascular Self-Management Outcome: 4 (good) Cardiovascular Comment: BP today 120/80  Respiratory Respiratory Symptoms Reported: No symptoms reported    Endocrine Patient reports the following symptoms related to hypoglycemia or hyperglycemia : No symptoms reported Is patient diabetic?: Yes Is patient checking blood sugars at home?: Yes Endocrine Conditions: Diabetes Endocrine Self-Management Outcome: 4 (good)  Gastrointestinal Gastrointestinal Symptoms Reported: No symptoms reported      Genitourinary Genitourinary Symptoms Reported: No symptoms reported    Integumentary Integumentary Symptoms Reported: No symptoms reported    Musculoskeletal Musculoskelatal Symptoms Reviewed: No symptoms reported        Psychosocial Psychosocial Symptoms Reported: No symptoms reported         Vitals:   05/10/24 0854  BP: 120/80    Medications Reviewed Today     Reviewed by Kenneth Andrea LABOR, RN (Registered Nurse) on 05/10/24 at [phone]  Kenneth List Status: <None>   Medication Order  Taking? Sig Documenting Provider Last Dose Status Informant  albuterol  (VENTOLIN  HFA) 108 (90 Base) MCG/ACT inhaler [id number]  Inhale 2 puffs into the lungs every 6 (six) hours as needed for wheezing or shortness of breath.  Patient not taking: Reported on 05/10/2024   Kenneth Greig PARAS, NP  Active   amiodarone (PACERONE) 200 MG tablet [id number] Yes Take 200 mg by mouth daily. [provider]  Active   amLODipine  (NORVASC ) 5 MG tablet [id number]  Take 1 tablet (5 mg total) by mouth daily.  Patient not taking: Reported on 05/10/2024   Kenneth Greig PARAS, NP  Active   apixaban  (ELIQUIS ) 5 MG TABS tablet [id number] Yes Take 5 mg by mouth 2 (two) times daily. [provider]  Active Self           Kenneth Marsh   Thu May 10, 2024  8:47 AM)    aspirin  EC 81 MG tablet [id number]  Take 1 tablet (81 mg total) by mouth daily. Swallow whole.  Patient not taking: Reported on 05/10/2024   Kenneth Marsh, Kenneth J, MD  Active   atorvastatin  (LIPITOR) 20 MG tablet [id number] Yes Take 1 tablet (20 mg total) by mouth daily. Kenneth Greig PARAS, NP  Active            Kenneth Marsh   Wed Apr 25, 2024 10:00 AM) Taking 40mg  daily  losartan  (COZAAR ) 50 MG tablet [id number]  Take 1 tablet (50 mg total) by mouth daily.  Patient not taking: Reported on 05/10/2024   Kenneth Greig PARAS, NP  Active   metFORMIN  (GLUCOPHAGE -XR) 500 MG 24 hr tablet [id number] Yes Take  1 tablet (500 mg total) by mouth 2 (two) times daily with Marsh meal. Kenneth Greig PARAS, NP  Active   metoprolol  succinate (TOPROL -XL) 50 MG 24 hr tablet [id number] Yes Take 50 mg by mouth 2 (two) times daily. Take with or immediately following Marsh meal. [provider]  Active   Kenneth List Note Beverlee Reyes ORN, CPhT 07/12/14 0204): Doesn't have Marsh pharmacy he uses regularly             Recommendation:   Continue Current Plan of Care  Follow Up Plan:   Telephone follow-up in 1 week  Andrea Dimes RN, BSN [place]  Value-Based  Care Institute [facility] Health RN Care Manager [phone]

## 2024-05-10 NOTE — Patient Instructions (Signed)
 Visit Information  Thank you for taking time to visit with me today. Please don't hesitate to contact me if I can be of assistance to you before our next scheduled telephone appointment.   Following is a copy of your care plan:   Goals Addressed             This Visit's Progress    VBCI Transitions of Care (TOC) Care Plan       Problems:  Recent Hospitalization for treatment of Atrial flutter Knowledge Deficit Related to Atrial Flutter  Goal:  Over the next 30 days, the patient will not experience hospital readmission  Interventions:  Transitions of Care: Doctor Visits  - discussed the importance of doctor visits Post discharge activity limitations prescribed by provider reviewed Reviewed Signs and symptoms of infection Medication review, questions answered Discussed Cardiology referral-patient has scheduled visit 06/27/24-revisited Reviewed provider notes and discussed  Patient Self Care Activities:  Attend all scheduled provider appointments Call provider office for new concerns or questions  Take medications as prescribed   check pulse (heart) rate before taking medicine make a plan to exercise regularly make a plan to eat healthy take medicine as prescribed  Plan:  Telephone follow up appointment with care management team member scheduled for:  05/17/24 at 9am        Patient verbalizes understanding of instructions and care plan provided today and agrees to view in MyChart. Active MyChart status and patient understanding of how to access instructions and care plan via MyChart confirmed with patient.     Telephone follow up appointment with care management team member scheduled for:05/17/24 at 9:30am  Please call the care guide team at [phone] if you need to cancel or reschedule your appointment.   Please call 1-800-273-TALK (toll free, 24 hour hotline) go to [facility] Urgent [facility] [address], [place] [phone]) call 911 if  you are experiencing a Mental Health or Behavioral Health Crisis or need someone to talk to.  Andrea Dimes RN, BSN [place]  Value-Based Care Institute [facility] Health RN Care Manager [phone]

## 2024-05-17 ENCOUNTER — Other Ambulatory Visit: Payer: Self-pay | Admitting: *Deleted

## 2024-05-17 NOTE — Patient Instructions (Signed)
 Visit Information  Thank you for taking time to visit with me today. Please don't hesitate to contact me if I can be of assistance to you before our next scheduled telephone appointment.   Following is a copy of your care plan:   Goals Addressed             This Visit's Progress    VBCI Transitions of Care (TOC) Care Plan       Problems:  Recent Hospitalization for treatment of Atrial flutter Knowledge Deficit Related to Atrial Flutter  Goal:  Over the next 30 days, the patient will not experience hospital readmission  Interventions:  Transitions of Care: Doctor Visits  - discussed the importance of doctor visits Post discharge activity limitations prescribed by provider reviewed Reviewed Signs and symptoms of infection Medication review, questions answered Discussed Cardiology referral-patient has scheduled visit 06/27/24-revisited Reviewed provider notes and discussed Reminded patient of resource [email or web address]  Provided education on daily weights and when to contact provider Advised calling for medication refills 3-4 days prior to running out of medication   Patient Self Care Activities:  Attend all scheduled provider appointments Call provider office for new concerns or questions  Take medications as prescribed   check pulse (heart) rate before taking medicine make a plan to exercise regularly make a plan to eat healthy take medicine as prescribed  Plan:  Telephone follow up appointment with care management team member scheduled for:  05/24/24 at 9:30am        Patient verbalizes understanding of instructions and care plan provided today and agrees to view in MyChart. Active MyChart status and patient understanding of how to access instructions and care plan via MyChart confirmed with patient.     Telephone follow up appointment with care management team member scheduled for:05/24/24 at 9:30pm  Please call the care guide team at [phone]  if you need to cancel or reschedule your appointment.   Please call 1-800-273-TALK (toll free, 24 hour hotline) go to [facility] Urgent [facility] [address], [place] [phone]) call 911 if you are experiencing a Mental Health or Behavioral Health Crisis or need someone to talk to.  Andrea Dimes RN, BSN [place]  Value-Based Care Institute [facility] Health RN Care Manager [phone]

## 2024-05-17 NOTE — Transitions of Care (Post Inpatient/ED Visit) (Signed)
 Transition of Care week 4  Visit Note  05/17/2024  Name: Kenneth Marsh MRN: [id number]          DOB: August 26, 1971  Situation: Patient enrolled in [facility] 30-day program. Visit completed with Kenneth Marsh by telephone.   Background:   Initial Transition Care Management Follow-up Telephone Call    Past Medical History:  Diagnosis Date   Atrial flutter (HCC)    Diabetes mellitus, type 2 (HCC) 09/30/2021   Hyperlipidemia 09/30/2021   Primary hypertension 09/29/2021    Assessment: Patient Reported Symptoms: Cognitive Cognitive Status: No symptoms reported      Neurological Neurological Review of Symptoms: No symptoms reported    HEENT HEENT Symptoms Reported: No symptoms reported      Cardiovascular Cardiovascular Symptoms Reported: No symptoms reported Cardiovascular Self-Management Outcome: 4 (good) Cardiovascular Comment: Taking medications as prescribed, checking BP and pulse daily  Respiratory Respiratory Symptoms Reported: No symptoms reported    Endocrine Endocrine Symptoms Reported: Not assessed    Gastrointestinal Gastrointestinal Symptoms Reported: No symptoms reported      Genitourinary Genitourinary Symptoms Reported: No symptoms reported    Integumentary Integumentary Symptoms Reported: Itching Additional Integumentary Details: itching when hot and sweaty. Denies rash or swelling Skin Self-Management Outcome: 3 (uncertain) Skin Comment: Patient plans to discuss with provider  Musculoskeletal Musculoskelatal Symptoms Reviewed: No symptoms reported        Psychosocial Psychosocial Symptoms Reported: No symptoms reported         Vitals:   05/17/24 1004  BP: 128/69  Pulse: 66    Medications Reviewed Today     Reviewed by Lucky Andrea LABOR, RN (Registered Nurse) on 05/17/24 at [phone]  Med List Status: <None>   Medication Order Taking? Sig Documenting Provider Last Dose Status Informant  albuterol  (VENTOLIN  HFA) 108 (90 Base) MCG/ACT inhaler [id number]   Inhale 2 puffs into the lungs every 6 (six) hours as needed for wheezing or shortness of breath.  Patient not taking: Reported on 05/17/2024   Lorren Greig PARAS, NP  Active   amiodarone (PACERONE) 200 MG tablet [id number] Yes Take 200 mg by mouth daily. [provider]  Active   amLODipine  (NORVASC ) 5 MG tablet [id number]  Take 1 tablet (5 mg total) by mouth daily.  Patient not taking: Reported on 05/17/2024   Lorren Greig PARAS, NP  Active   apixaban  (ELIQUIS ) 5 MG TABS tablet [id number] Yes Take 5 mg by mouth 2 (two) times daily. [provider]  Active Self           Med Note (Tiandre Teall A   Thu May 10, 2024  8:47 AM)    aspirin  EC 81 MG tablet [id number]  Take 1 tablet (81 mg total) by mouth daily. Swallow whole.  Patient not taking: Reported on 05/17/2024   Krasowski, Robert J, MD  Active   atorvastatin  (LIPITOR) 20 MG tablet [id number] Yes Take 1 tablet (20 mg total) by mouth daily. Lorren Greig PARAS, NP  Active            Med Note (Prabhav Faulkenberry A   Wed Apr 25, 2024 10:00 AM) Taking 40mg  daily  losartan  (COZAAR ) 50 MG tablet [id number]  Take 1 tablet (50 mg total) by mouth daily.  Patient not taking: Reported on 05/17/2024   Lorren Greig PARAS, NP  Active   metFORMIN  (GLUCOPHAGE -XR) 500 MG 24 hr tablet [id number] Yes Take 1 tablet (500 mg total) by mouth 2 (two) times daily with a meal. Lorren Greig PARAS, NP  Active   metoprolol  succinate (TOPROL -XL) 50 MG 24 hr tablet [id number] Yes Take 50 mg by mouth 2 (two) times daily. Take with or immediately following a meal. [provider]  Active   Med List Note Beverlee Reyes ORN, CPhT 07/12/14 0204): Doesn't have a pharmacy he uses regularly             Recommendation:   Continue Current Plan of Care  Follow Up Plan:   Telephone follow-up in 1 week Andrea Dimes RN, BSN [place]  Value-Based Care Institute [facility] Health RN Care Manager [phone]

## 2024-05-22 NOTE — Telephone Encounter (Signed)
 I called patient with interpreter Melonie [phone] and left voicemail to return my call.

## 2024-05-24 ENCOUNTER — Encounter: Payer: Self-pay | Admitting: *Deleted

## 2024-05-24 ENCOUNTER — Telehealth: Payer: Self-pay | Admitting: *Deleted

## 2024-05-25 ENCOUNTER — Telehealth: Payer: Self-pay

## 2024-05-25 ENCOUNTER — Other Ambulatory Visit: Payer: Self-pay | Admitting: Pharmacist

## 2024-05-25 DIAGNOSIS — E785 Hyperlipidemia, unspecified: Secondary | ICD-10-CM

## 2024-05-25 MED ORDER — APIXABAN 5 MG PO TABS: 5.0000 mg | ORAL_TABLET | Freq: Two times a day (BID) | ORAL | 0 refills | Status: DC

## 2024-05-25 MED ORDER — METOPROLOL SUCCINATE ER 50 MG PO TB24: 50.0000 mg | ORAL_TABLET | Freq: Two times a day (BID) | ORAL | 0 refills | Status: DC

## 2024-05-25 MED ORDER — AMIODARONE HCL 200 MG PO TABS: 200.0000 mg | ORAL_TABLET | Freq: Every day | ORAL | 0 refills | Status: DC

## 2024-05-25 MED ORDER — ATORVASTATIN CALCIUM 20 MG PO TABS: 20.0000 mg | ORAL_TABLET | Freq: Every day | ORAL | 1 refills | Status: DC

## 2024-05-25 NOTE — Transitions of Care (Post Inpatient/ED Visit) (Addendum)
 Transition of Care Week #5  Visit Note  05/25/2024  Name: Kenneth Marsh MRN: [id number]          DOB: December 31, 1970  Situation: Patient enrolled in [facility] 30-day program. Visit completed with patient by telephone. He states that he is out of his medications except for metformin . He does not see cardiology until August per patient.  RN CM called Dr. Karry office.  They will not refill medication as he is a new patient.  Advised patient to call PCP to ask if they would at least send a 30 day refill. He states he will call.  RN CM also sent a message to PCP office and team to request refills.  Message also sent to Pharmacist for PCP office Herlene Salinas Ausdall.  1:33 pm: Telephone call to patient to follow up.  No answer. Message left.    3:27 pm: Update from Pharmacist Herlene Salinas Ausdall- 30 day supply refills for medications sent to patient pharmacy. Telephone call to patient to notify of update.  No answer. Message left tp return call to RN CM.    4:42 pm Telephone call to patient.  Advised that medication have been sent to his pharmacy.  He verbalized understanding.     Background:   Initial Transition Care Management Follow-up Telephone Call    Past Medical History:  Diagnosis Date   Atrial flutter (HCC)    Diabetes mellitus, type 2 (HCC) 09/30/2021   Hyperlipidemia 09/30/2021   Primary hypertension 09/29/2021    Assessment: Patient Reported Symptoms: Cognitive Cognitive Status: Alert and oriented to person, place, and time, Normal speech and language skills      Neurological Neurological Review of Symptoms: No symptoms reported    HEENT HEENT Symptoms Reported: Not assessed      Cardiovascular Cardiovascular Symptoms Reported: No symptoms reported    Respiratory Respiratory Symptoms Reported: No symptoms reported    Endocrine Endocrine Symptoms Reported: Not assessed    Gastrointestinal Gastrointestinal Symptoms Reported: Not assessed      Genitourinary  Genitourinary Symptoms Reported: Not assessed    Integumentary Integumentary Symptoms Reported: Not assessed    Musculoskeletal Musculoskelatal Symptoms Reviewed: Not assessed        Psychosocial Psychosocial Symptoms Reported: Not assessed         There were no vitals filed for this visit.  Medications Reviewed Today     Reviewed by Mylan Schwarz, RN (Case Manager) on 05/25/24 at 0919  Med List Status: <None>   Medication Order Taking? Sig Documenting Provider Last Dose Status Informant  albuterol  (VENTOLIN  HFA) 108 (90 Base) MCG/ACT inhaler [id number]  Inhale 2 puffs into the lungs every 6 (six) hours as needed for wheezing or shortness of breath.  Patient not taking: Reported on 05/17/2024   Lorren Greig PARAS, NP  Active   amiodarone  (PACERONE ) 200 MG tablet [id number] Yes Take 200 mg by mouth daily. [provider]  Active   amLODipine  (NORVASC ) 5 MG tablet [id number]  Take 1 tablet (5 mg total) by mouth daily.  Patient not taking: Reported on 05/17/2024   Lorren Greig PARAS, NP  Active   apixaban  (ELIQUIS ) 5 MG TABS tablet [id number] Yes Take 5 mg by mouth 2 (two) times daily. [provider]  Active Self           Med Note (ROBB, MELANIE A   Thu May 10, 2024  8:47 AM)    aspirin  EC 81 MG tablet [id number]  Take 1 tablet (81 mg total) by mouth  daily. Swallow whole.  Patient not taking: Reported on 05/17/2024   Krasowski, Robert J, MD  Active   atorvastatin  (LIPITOR) 20 MG tablet [id number] Yes Take 1 tablet (20 mg total) by mouth daily. Lorren Greig PARAS, NP  Active            Med Note (ROBB, MELANIE A   Wed Apr 25, 2024 10:00 AM) Taking 40mg  daily  losartan  (COZAAR ) 50 MG tablet [id number]  Take 1 tablet (50 mg total) by mouth daily.  Patient not taking: Reported on 05/17/2024   Lorren Greig PARAS, NP  Active   metFORMIN  (GLUCOPHAGE -XR) 500 MG 24 hr tablet [id number] Yes Take 1 tablet (500 mg total) by mouth 2 (two) times daily with a meal. Lorren Greig PARAS, NP  Active    metoprolol  succinate (TOPROL -XL) 50 MG 24 hr tablet [id number] Yes Take 50 mg by mouth 2 (two) times daily. Take with or immediately following a meal. [provider]  Active   Med List Note Beverlee Reyes ORN, CPhT 07/12/14 0204): Doesn't have a pharmacy he uses regularly             Recommendation:   PCP Follow-up  Follow Up Plan:   Telephone follow-up Next business day regarding medication status.  Bettymae Yott J. Avaiah Stempel RN, MSN [facility], [facility] Health RN Care Manager Direct Dial: [phone]  Fax: [phone] Website: [email or web address]

## 2024-05-25 NOTE — Patient Instructions (Signed)
 Visit Information  Thank you for taking time to visit with me today. Please don't hesitate to contact me if I can be of assistance to you.      Following is a copy of your care plan:   Goals Addressed             This Visit's Progress    VBCI Transitions of Care (TOC) Care Plan       Problems:  Recent Hospitalization for treatment of Atrial flutter Knowledge Deficit Related to Atrial Flutter  Goal:  Over the next 30 days, the patient will not experience hospital readmission  Interventions:  Transitions of Care: Doctor Visits  - discussed the importance of doctor visits Post discharge activity limitations prescribed by provider reviewed Reviewed Signs and symptoms of infection Medication review, questions answered Discussed Cardiology referral-patient has scheduled visit 06/27/24-revisited Reviewed provider notes and discussed Reminded patient of resource [email or web address]  Provided education on daily weights and when to contact provider Advised calling for medication refills 3-4 days prior to running out of medication   Patient Self Care Activities:  Attend all scheduled provider appointments Call provider office for new concerns or questions  Take medications as prescribed   check pulse (heart) rate before taking medicine make a plan to exercise regularly make a plan to eat healthy take medicine as prescribed  Plan: RN CM will call back to patient the next business day concerning medications.         Patient verbalizes understanding of instructions and care plan provided today and agrees to view in MyChart. Active MyChart status and patient understanding of how to access instructions and care plan via MyChart confirmed with patient.     The patient has been provided with contact information for the care management team and has been advised to call with any health related questions or concerns.   Please call the care guide team at [phone]  if you need to cancel or reschedule your appointment.   Please call the Suicide and Crisis Lifeline: 988 if you are experiencing a Mental Health or Behavioral Health Crisis or need someone to talk to.  Andrei Mccook J. Nelton Amsden RN, MSN [facility] [place] [facility], [facility] Health RN Care Manager Direct Dial: [phone]  Fax: [phone] Website: [email or web address]

## 2024-05-28 ENCOUNTER — Telehealth: Payer: Self-pay | Admitting: *Deleted

## 2024-05-28 ENCOUNTER — Other Ambulatory Visit: Payer: Self-pay

## 2024-05-28 ENCOUNTER — Other Ambulatory Visit (HOSPITAL_COMMUNITY): Payer: Self-pay

## 2024-05-28 ENCOUNTER — Other Ambulatory Visit: Payer: Self-pay | Admitting: Pharmacist

## 2024-05-28 DIAGNOSIS — I4892 Unspecified atrial flutter: Secondary | ICD-10-CM

## 2024-05-28 MED ORDER — APIXABAN 5 MG PO TABS
5.0000 mg | ORAL_TABLET | Freq: Two times a day (BID) | ORAL | 0 refills | Status: AC
  Filled 2024-05-28 (×2): qty 60, 30d supply, fill #0

## 2024-05-28 NOTE — Transitions of Care (Post Inpatient/ED Visit) (Signed)
   05/28/2024  Name: Isael Stille MRN: [id number] DOB: [birth year]  RNCM attempting to reach Mr. Harrower regarding medication needs. Pharmacist, Herlene has arranged a 30 day free refill of Eliquis  through the [facility]. A detailed message was left, on Mr. Plante voicemail, including the pharmacy address and hours. RNCM will follow up with Mr. Whang on 05/30/24.  Andrea Dimes RN, BSN [place]  Value-Based Care Institute [facility] Health RN Care Manager [phone]

## 2024-05-28 NOTE — Transitions of Care (Post Inpatient/ED Visit) (Signed)
 Transition of Care week 6  Visit Note  05/28/2024  Name: Kenneth Marsh MRN: [id number]          DOB: 24-May-1971  Situation: Patient enrolled in [facility] 30-day program. Visit completed with Kenneth Marsh by telephone.   Background:   Initial Transition Care Management Follow-up Telephone Call    Past Medical History:  Diagnosis Date   Atrial flutter (HCC)    Diabetes mellitus, type 2 (HCC) 09/30/2021   Hyperlipidemia 09/30/2021   Primary hypertension 09/29/2021    Assessment: Patient Reported Symptoms: Cognitive Cognitive Status: No symptoms reported      Neurological Neurological Review of Symptoms: No symptoms reported    HEENT HEENT Symptoms Reported: Not assessed      Cardiovascular Cardiovascular Symptoms Reported: No symptoms reported    Respiratory Respiratory Symptoms Reported: No symptoms reported    Endocrine Endocrine Symptoms Reported: Not assessed    Gastrointestinal Gastrointestinal Symptoms Reported: Not assessed      Genitourinary Genitourinary Symptoms Reported: Not assessed    Integumentary Integumentary Symptoms Reported: Not assessed    Musculoskeletal Musculoskelatal Symptoms Reviewed: Not assessed        Psychosocial Psychosocial Symptoms Reported: Not assessed         There were no vitals filed for this visit.  Medications Reviewed Today     Reviewed by Lucky Andrea LABOR, RN (Registered Nurse) on 05/28/24 at 1546  Med List Status: <None>   Medication Order Taking? Sig Documenting Provider Last Dose Status Informant  albuterol  (VENTOLIN  HFA) 108 (90 Base) MCG/ACT inhaler [id number]  Inhale 2 puffs into the lungs every 6 (six) hours as needed for wheezing or shortness of breath.  Patient not taking: Reported on 05/28/2024   Lorren Greig PARAS, NP  Active   amiodarone  (PACERONE ) 200 MG tablet [id number] Yes Take 1 tablet (200 mg total) by mouth daily.  Patient taking differently: Take 1 tablet (200 mg total) by mouth daily.   Newlin,  Enobong, MD  Active   amLODipine  (NORVASC ) 5 MG tablet [id number]  Take 1 tablet (5 mg total) by mouth daily.  Patient not taking: Reported on 05/28/2024   Lorren Greig PARAS, NP  Active   apixaban  (ELIQUIS ) 5 MG TABS tablet [id number] Yes Take 1 tablet (5 mg total) by mouth 2 (two) times daily. Newlin, Enobong, MD  Active   aspirin  EC 81 MG tablet [id number]  Take 1 tablet (81 mg total) by mouth daily. Swallow whole.  Patient not taking: Reported on 05/28/2024   Krasowski, Robert J, MD  Active   atorvastatin  (LIPITOR) 20 MG tablet [id number] Yes Take 1 tablet (20 mg total) by mouth daily.  Patient taking differently: Take 1 tablet (20 mg total) by mouth daily.   Newlin, Enobong, MD  Active   losartan  (COZAAR ) 50 MG tablet [id number]  Take 1 tablet (50 mg total) by mouth daily.  Patient not taking: Reported on 05/28/2024   Lorren Greig PARAS, NP  Active   metFORMIN  (GLUCOPHAGE -XR) 500 MG 24 hr tablet [id number] Yes Take 1 tablet (500 mg total) by mouth 2 (two) times daily with a meal. Lorren Greig PARAS, NP  Active   metoprolol  succinate (TOPROL -XL) 50 MG 24 hr tablet [id number] Yes Take 1 tablet (50 mg total) by mouth 2 (two) times daily. Take with or immediately following a meal.  Patient taking differently: Take 1 tablet (50 mg total) by mouth 2 (two) times daily. Take with or immediately following a meal.   Newlin, Enobong, MD  Active  Med List Note Beverlee Reyes LELON Bishop 07/12/14 0204): Doesn't have a pharmacy he uses regularly             Recommendation:   Continue Current Plan of Care  Follow Up Plan:   Telephone follow-up in 2 days  Andrea Dimes RN, BSN [place]  Value-Based Care Institute [facility] Health RN Care Manager [phone]

## 2024-05-28 NOTE — Patient Instructions (Signed)
 Visit Information  Thank you for taking time to visit with me today. Please don't hesitate to contact me if I can be of assistance to you before our next scheduled telephone appointment.   Following is a copy of your care plan:   Goals Addressed             This Visit's Progress    VBCI Transitions of Care (TOC) Care Plan       Problems:  Recent Hospitalization for treatment of Atrial flutter Knowledge Deficit Related to Atrial Flutter  Goal:  Over the next 30 days, the patient will not experience hospital readmission  Interventions:  Transitions of Care: Doctor Visits  - discussed the importance of doctor visits Post discharge activity limitations prescribed by provider reviewed Reviewed Signs and symptoms of infection Medication review, contacted [facility] and assisted patient with checking on status of refills and requesting coupon for Eliquis , total for 4 medications wil be $735.84, patient aware Discussed Cardiology referral-patient has scheduled visit 06/27/24-revisited Reviewed provider notes and discussed Reminded patient of resource [email or web address]  Pharmacy referral for assistance with affording medications    Patient Self Care Activities:  Attend all scheduled provider appointments Call provider office for new concerns or questions  Take medications as prescribed   check pulse (heart) rate before taking medicine make a plan to exercise regularly make a plan to eat healthy take medicine as prescribed  Plan: RN CM will call back later this week to ensure patient picked up prescriptions.        Patient verbalizes understanding of instructions and care plan provided today and agrees to view in MyChart. Active MyChart status and patient understanding of how to access instructions and care plan via MyChart confirmed with patient.     Telephone follow up appointment with care management team member scheduled for:05/30/24 at  2:30pm  Please call the care guide team at [phone] if you need to cancel or reschedule your appointment.   Please call 1-800-273-TALK (toll free, 24 hour hotline) go to [facility] [address] [place] [phone]) call 911 if you are experiencing a Mental Health or Behavioral Health Crisis or need someone to talk to.  Andrea Dimes RN, BSN [place]  [facility] RN Care Manager [phone]

## 2024-05-29 ENCOUNTER — Other Ambulatory Visit: Payer: Self-pay

## 2024-05-30 ENCOUNTER — Other Ambulatory Visit: Payer: Self-pay | Admitting: *Deleted

## 2024-05-30 DIAGNOSIS — I4892 Unspecified atrial flutter: Secondary | ICD-10-CM

## 2024-05-30 NOTE — Transitions of Care (Post Inpatient/ED Visit) (Signed)
 Transition of Care week 6  Visit Note  05/30/2024  Name: Kenneth Marsh MRN: [id number]          DOB: Jun 10, 1971  Situation: Patient enrolled in [facility] 30-day program. Visit completed with Kenneth Marsh by telephone.   Background:   Initial Transition Care Management Follow-up Telephone Call    Past Medical History:  Diagnosis Date   Atrial flutter (HCC)    Diabetes mellitus, type 2 (HCC) 09/30/2021   Hyperlipidemia 09/30/2021   Primary hypertension 09/29/2021    Assessment: Patient Reported Symptoms: Cognitive Cognitive Status: No symptoms reported      Neurological Neurological Review of Symptoms: No symptoms reported    HEENT HEENT Symptoms Reported: Not assessed      Cardiovascular Cardiovascular Symptoms Reported: No symptoms reported Cardiovascular Self-Management Outcome: 4 (good)  Respiratory Respiratory Symptoms Reported: No symptoms reported    Endocrine Endocrine Symptoms Reported: Not assessed    Gastrointestinal Gastrointestinal Symptoms Reported: No symptoms reported      Genitourinary Genitourinary Symptoms Reported: Not assessed    Integumentary Integumentary Symptoms Reported: No symptoms reported    Musculoskeletal Musculoskelatal Symptoms Reviewed: No symptoms reported        Psychosocial Psychosocial Symptoms Reported: Not assessed         There were no vitals filed for this visit.  Medications Reviewed Today     Reviewed by Kenneth Andrea LABOR, RN (Registered Nurse) on 05/30/24 at 1436  Med List Status: <None>   Medication Order Taking? Sig Documenting Provider Last Dose Status Informant  albuterol  (VENTOLIN  HFA) 108 (90 Base) MCG/ACT inhaler [id number]  Inhale 2 puffs into the lungs every 6 (six) hours as needed for wheezing or shortness of breath.  Patient not taking: Reported on 05/30/2024   Lorren Greig PARAS, NP  Active   amiodarone  (PACERONE ) 200 MG tablet [id number] Yes Take 1 tablet (200 mg total) by mouth daily. Newlin, Enobong, MD   Active   amLODipine  (NORVASC ) 5 MG tablet [id number]  Take 1 tablet (5 mg total) by mouth daily.  Patient not taking: Reported on 05/30/2024   Lorren Greig PARAS, NP  Active   apixaban  (ELIQUIS ) 5 MG TABS tablet [id number] Yes Take 1 tablet (5 mg total) by mouth 2 (two) times daily. Newlin, Enobong, MD  Active   aspirin  EC 81 MG tablet [id number]  Take 1 tablet (81 mg total) by mouth daily. Swallow whole.  Patient not taking: Reported on 05/30/2024   Krasowski, Robert J, MD  Active   atorvastatin  (LIPITOR) 20 MG tablet [id number] Yes Take 1 tablet (20 mg total) by mouth daily. Newlin, Enobong, MD  Active   losartan  (COZAAR ) 50 MG tablet [id number]  Take 1 tablet (50 mg total) by mouth daily.  Patient not taking: Reported on 05/30/2024   Lorren Greig PARAS, NP  Active   metFORMIN  (GLUCOPHAGE -XR) 500 MG 24 hr tablet [id number] Yes Take 1 tablet (500 mg total) by mouth 2 (two) times daily with a meal. Lorren Greig PARAS, NP  Active   metoprolol  succinate (TOPROL -XL) 50 MG 24 hr tablet [id number] Yes Take 1 tablet (50 mg total) by mouth 2 (two) times daily. Take with or immediately following a meal. Newlin, Enobong, MD  Active   Med List Note Beverlee Reyes ORN, CPhT 07/12/14 0204): Doesn't have a pharmacy he uses regularly             Recommendation:   Continue Current Plan of Care  Follow Up Plan:   Referral to RN Case Manager Closing  From:  Transitions of Care Program  Andrea Dimes RN, BSN [place]  [facility] RN Care Manager [phone]

## 2024-05-31 NOTE — Telephone Encounter (Signed)
 I  returned patient call  with interpreter Rozann [phone]  no one answered so I left a voicemail to return my call.

## 2024-06-06 ENCOUNTER — Other Ambulatory Visit: Payer: Self-pay | Admitting: *Deleted

## 2024-06-06 ENCOUNTER — Telehealth: Payer: Self-pay | Admitting: *Deleted

## 2024-06-06 NOTE — Patient Outreach (Signed)
 Kenneth Marsh  DOB: 1971/01/06 PCP: [facility]  RNCM outreached this scheduled appointment this morning. Pt driving and was not aware of today's appointment, didn't have his scheduled for rescheduling at this time.  Message sent to care-guide to reschedule.   Olam Ku, RN, BSN [place]  [facility], [facility] RN Care Manager Direct Dial: [phone]  Fax: [phone]

## 2024-06-06 NOTE — Patient Outreach (Signed)
ERROR   ENTRY

## 2024-06-27 ENCOUNTER — Encounter: Payer: Self-pay | Admitting: Cardiology

## 2024-06-27 ENCOUNTER — Ambulatory Visit: Payer: Self-pay | Attending: Cardiology | Admitting: Cardiology

## 2024-06-27 VITALS — BP 120/82 | HR 66 | Ht 68.0 in | Wt 209.4 lb

## 2024-06-27 DIAGNOSIS — I420 Dilated cardiomyopathy: Secondary | ICD-10-CM | POA: Insufficient documentation

## 2024-06-27 DIAGNOSIS — I10 Essential (primary) hypertension: Secondary | ICD-10-CM

## 2024-06-27 DIAGNOSIS — E119 Type 2 diabetes mellitus without complications: Secondary | ICD-10-CM

## 2024-06-27 DIAGNOSIS — E782 Mixed hyperlipidemia: Secondary | ICD-10-CM

## 2024-06-27 DIAGNOSIS — I4892 Unspecified atrial flutter: Secondary | ICD-10-CM

## 2024-06-27 MED ORDER — METOPROLOL SUCCINATE ER 50 MG PO TB24: 50.0000 mg | ORAL_TABLET | Freq: Two times a day (BID) | ORAL | 3 refills | Status: DC

## 2024-06-27 MED ORDER — AMIODARONE HCL 200 MG PO TABS: 200.0000 mg | ORAL_TABLET | Freq: Every day | ORAL | 3 refills | Status: AC

## 2024-06-27 NOTE — Patient Instructions (Addendum)
 Medication Instructions:  Your physician recommends that you continue on your current medications as directed. Please refer to the Current Medication list given to you today.  *If you need a refill on your cardiac medications before your next appointment, please call your pharmacy*   Lab Work: 3rd Floor [address] Lipid, AST, ALT, TSH- today If you have labs (blood work) drawn today and your tests are completely normal, you will receive your results only by: MyChart Message (if you have MyChart) OR A paper copy in the mail If you have any lab test that is abnormal or we need to change your treatment, we will call you to review the results.   Testing/Procedures: Your physician has requested that you have an echocardiogram. Echocardiography is a painless test that uses sound waves to create images of your heart. It provides your doctor with information about the size and shape of your heart and how well your heart's chambers and valves are working. This procedure takes approximately one hour. There are no restrictions for this procedure. Please do NOT wear cologne, perfume, aftershave, or lotions (deodorant is allowed). Please arrive 15 minutes prior to your appointment time.  Please note: We ask at that you not bring children with you during ultrasound (echo/ vascular) testing. Due to room size and safety concerns, children are not allowed in the ultrasound rooms during exams. Our front office staff cannot provide observation of children in our lobby area while testing is being conducted. An adult accompanying a patient to their appointment will only be allowed in the ultrasound room at the discretion of the ultrasound technician under special circumstances. We apologize for any inconvenience.    Follow-Up: At [facility], you and your health needs are our priority.  As part of our continuing mission to provide you with exceptional heart care, we have created designated Provider Care Teams.   These Care Teams include your primary Cardiologist (physician) and Advanced Practice Providers (APPs -  Physician Assistants and Nurse Practitioners) who all work together to provide you with the care you need, when you need it.  We recommend signing up for the patient portal called MyChart.  Sign up information is provided on this After Visit Summary.  MyChart is used to connect with patients for Virtual Visits (Telemedicine).  Patients are able to view lab/test results, encounter notes, upcoming appointments, etc.  Non-urgent messages can be sent to your provider as well.   To learn more about what you can do with MyChart, go to [email or web address].    Your next appointment:   2 month(s)  The format for your next appointment:   In Person  Provider:   Lamar Fitch, MD    Other Instructions Appt with Dr. Inocencio

## 2024-06-27 NOTE — Addendum Note (Signed)
 Addended by: ARLOA PLANAS D on: 06/27/2024 09:11 AM   Modules accepted: Orders

## 2024-06-27 NOTE — Progress Notes (Signed)
 Cardiology Office Note:    Date:  06/27/2024   ID:  Kenneth Marsh, Kenneth Marsh, MRN [id number]  PCP:  Lorren Greig PARAS, NP  Cardiologist:  Lamar Fitch, MD    Referring MD: Alden Milder, MD   No chief complaint on file.   History of Present Illness:    Kenneth Marsh is a 53 y.o. male past medical history significant for paroxysmal atrial flutter, he did have cardioversion after TEE in 2020 at that time his CHADS2 Vascor equals 1 because of hypertension he was not anticoagulated and he was doing quite well he also got coronary CT angio which did not show any critical stenosis at that time.  Recently he ended up going to [facility] because of palpitation he was found to be in atrial flutter his echocardiogram showed ejection fraction 40 to 45%.  He was put amiodarone  anticoagulation since he developed new onset of diabetes and eventually end up having cardioversion comes today to months for follow-up.  He is being on amiodarone  and Eliquis  and long-acting beta-blocker as well as losartan .  Overall doing well denies have any chest pain tightness squeezing pressure burning chest he described to have some stabbing short pinching needlelike sensation in the heart area but otherwise doing fine.  He works physically have no difficulty doing it.  No recurrences of arrhythmia since that time discharged from [facility]  Past Medical History:  Diagnosis Date   Atrial flutter (HCC)    Diabetes mellitus, type 2 (HCC) 09/30/2021   Hyperlipidemia 09/30/2021   Primary hypertension 09/29/2021    Past Surgical History:  Procedure Laterality Date   CARDIOVERSION N/A 08/07/2018   Procedure: CARDIOVERSION;  Surgeon: Rolan Ezra RAMAN, MD;  Location: [facility] ENDOSCOPY;  Service: Cardiovascular;  Laterality: N/A;   TEE WITHOUT CARDIOVERSION N/A 08/07/2018   Procedure: TRANSESOPHAGEAL ECHOCARDIOGRAM (TEE);  Surgeon: Rolan Ezra RAMAN, MD;  Location: [facility] ENDOSCOPY;  Service:  Cardiovascular;  Laterality: N/A;    Current Medications: Current Meds  Medication Sig   albuterol  (VENTOLIN  HFA) 108 (90 Base) MCG/ACT inhaler Inhale 2 puffs into the lungs every 6 (six) hours as needed for wheezing or shortness of breath.   amiodarone  (PACERONE ) 200 MG tablet Take 1 tablet (200 mg total) by mouth daily.   amLODipine  (NORVASC ) 5 MG tablet Take 1 tablet (5 mg total) by mouth daily.   apixaban  (ELIQUIS ) 5 MG TABS tablet Take 1 tablet (5 mg total) by mouth 2 (two) times daily.   atorvastatin  (LIPITOR) 20 MG tablet Take 1 tablet (20 mg total) by mouth daily.   metFORMIN  (GLUCOPHAGE -XR) 500 MG 24 hr tablet Take 1 tablet (500 mg total) by mouth 2 (two) times daily with a meal.   metoprolol  succinate (TOPROL -XL) 50 MG 24 hr tablet Take 1 tablet (50 mg total) by mouth 2 (two) times daily. Take with or immediately following a meal.     Allergies:   Patient has no known allergies.   Social History   Socioeconomic History   Marital status: Divorced    Spouse name: Not on file   Number of children: Not on file   Years of education: Not on file   Highest education level: Not on file  Occupational History   Not on file  Tobacco Use   Smoking status: Never    Passive exposure: Never   Smokeless tobacco: Never  Vaping Use   Vaping status: Never Used  Substance and Sexual Activity   Alcohol use: Not Currently   Drug  use: No   Sexual activity: Yes  Other Topics Concern   Not on file  Social History Narrative   Not on file   Social Drivers of Health   Financial Resource Strain: Low Risk  (05/07/2024)   Overall Financial Resource Strain (CARDIA)    Difficulty of Paying Living Expenses: Not hard at all  Food Insecurity: No Food Insecurity (04/25/2024)   Hunger Vital Sign    Worried About Running Out of Food in the Last Year: Never true    Ran Out of Food in the Last Year: Never true  Transportation Needs: No Transportation Needs (04/25/2024)   PRAPARE - Therapist, art (Medical): No    Lack of Transportation (Non-Medical): No  Physical Activity: Inactive (05/07/2024)   Exercise Vital Sign    Days of Exercise per Week: 0 days    Minutes of Exercise per Session: 0 min  Stress: No Stress Concern Present (05/07/2024)   [employer] of Occupational Health - Occupational Stress Questionnaire    Feeling of Stress: Only a little  Social Connections: Unknown (03/25/2022)   Received from [facility]   Social Network    Social Network: Not on file     Family History: The patient's family history includes Hypertension in his father. ROS:   Please see the history of present illness.    All 14 point review of systems negative except as described per history of present illness  EKGs/Labs/Other Studies Reviewed:    EKG Interpretation Date/Time:  Wednesday June 27 2024 08:36:26 EDT Ventricular Rate:  66 PR Interval:  184 QRS Duration:  100 QT Interval:  446 QTC Calculation: 467 R Axis:   64  Text Interpretation: Normal sinus rhythm Normal ECG When compared with ECG of 18-Aug-2018 11:21, Non-specific change in ST segment in Inferior leads Confirmed by Bernie Charleston [phone]) on 06/27/2024 8:47:14 AM    Recent Labs: 09/21/2023: BUN 13; Creatinine, Ser 0.78; Potassium 4.1; Sodium 136  Recent Lipid Panel    Component Value Date/Time   CHOL 223 (H) 09/21/2023 0943   TRIG 132 09/21/2023 0943   HDL 31 (L) 09/21/2023 0943   CHOLHDL 7.2 (H) 09/21/2023 0943   LDLCALC 168 (H) 09/21/2023 0943    Physical Exam:    VS:  BP 120/82   Pulse 66   Ht 5' 8 (1.727 m)   Wt 209 lb 6.4 oz (95 kg)   SpO2 95%   BMI 31.84 kg/m     Wt Readings from Last 3 Encounters:  06/27/24 209 lb 6.4 oz (95 kg)  05/07/24 213 lb 9.6 oz (96.9 kg)  09/21/23 213 lb 12.8 oz (97 kg)     GEN:  Well nourished, well developed in no acute distress HEENT: Normal NECK: No JVD; No carotid bruits LYMPHATICS: No lymphadenopathy CARDIAC: RRR, no murmurs, no  rubs, no gallops RESPIRATORY:  Clear to auscultation without rales, wheezing or rhonchi  ABDOMEN: Soft, non-tender, non-distended MUSCULOSKELETAL:  No edema; No deformity  SKIN: Warm and dry LOWER EXTREMITIES: no swelling NEUROLOGIC:  Alert and oriented x 3 PSYCHIATRIC:  Normal affect   ASSESSMENT:    1. Atrial flutter, unspecified type (HCC)   2. Primary hypertension   3. Type 2 diabetes mellitus without complication, without long-term current use of insulin (HCC)   4. Mixed hyperlipidemia   5. Dilated cardiomyopathy (HCC)    PLAN:    In order of problems listed above:  Atrial flutter paroxysmal I will refer him to our  EP team for consideration of atrial flutter ablation which will be the best solution for him.  He is only 53 he is taking amiodarone  right now not a good solution long-term.  Continue anticoagulation in the meantime. Cardiomyopathy ejection fraction 4045% I suspect tachycardia related, that was discovered when he had atrial flutter, will repeat echocardiogram if echocardiogram still revealed diminished left ventricle ejection fraction we will pursue ischemia workup. Dyslipidemia he was put on Lipitor 20 which is excellent choice.  Will check his fasting lipid profile I do see his fasting lipid profile from last year with LDL 168 HDL 31 obviously not controlled well we need to get his LDL less than 70. Diabetes mellitus I see blood test from June 23 of this year a hemoglobin A1c 6.5.  He is on glyco-Glucophage  XR which we will continue. Essential hypertension blood pressure well-controlled   Medication Adjustments/Labs and Tests Ordered: Current medicines are reviewed at length with the patient today.  Concerns regarding medicines are outlined above.  Orders Placed This Encounter  Procedures   EKG 12-Lead   Medication changes: No orders of the defined types were placed in this encounter.   Signed, Lamar DOROTHA Fitch, MD, [facility] 06/27/2024 9:04 AM    [facility]

## 2024-06-28 LAB — LIPID PANEL
Chol/HDL Ratio: 5.4 ratio — ABNORMAL HIGH (ref 0.0–5.0)
Cholesterol, Total: 130 mg/dL (ref 100–199)
HDL: 24 mg/dL — ABNORMAL LOW (ref >39)
LDL Chol Calc (NIH): 84 mg/dL (ref 0–99)
Triglycerides: 118 mg/dL (ref 0–149)
VLDL Cholesterol Cal: 22 mg/dL (ref 5–40)

## 2024-06-28 LAB — ALT: ALT: 40 IU/L (ref 0–44)

## 2024-06-28 LAB — TSH: TSH: 2.91 u[IU]/mL (ref 0.450–4.500)

## 2024-06-28 LAB — AST: AST: 26 IU/L (ref 0–40)

## 2024-07-02 ENCOUNTER — Ambulatory Visit: Payer: Self-pay | Admitting: Cardiology

## 2024-07-02 DIAGNOSIS — E785 Hyperlipidemia, unspecified: Secondary | ICD-10-CM

## 2024-07-02 DIAGNOSIS — E782 Mixed hyperlipidemia: Secondary | ICD-10-CM

## 2024-07-23 ENCOUNTER — Ambulatory Visit (HOSPITAL_BASED_OUTPATIENT_CLINIC_OR_DEPARTMENT_OTHER)
Admission: RE | Admit: 2024-07-23 | Discharge: 2024-07-23 | Disposition: A | Discharge status: Home / Self Care | Payer: Self-pay | Source: Ambulatory Visit | Attending: Cardiology | Admitting: Cardiology

## 2024-07-23 DIAGNOSIS — I420 Dilated cardiomyopathy: Secondary | ICD-10-CM | POA: Insufficient documentation

## 2024-07-23 LAB — ECHOCARDIOGRAM COMPLETE
AR max vel: 2.89 cm2
AV Area VTI: 2.78 cm2
AV Area mean vel: 2.88 cm2
AV Mean grad: 3 mmHg
AV Peak grad: 5 mmHg
AV Vena cont: 0.3 cm
Ao pk vel: 1.12 m/s
Area-P 1/2: 3.88 cm2
Calc EF: 55.9 %
MV M vel: 5.57 m/s
MV Peak grad: 124.1 mmHg
S' Lateral: 3.5 cm
Single Plane A2C EF: 55.5 %
Single Plane A4C EF: 56 %

## 2024-08-01 MED ORDER — ATORVASTATIN CALCIUM 40 MG PO TABS: 40.0000 mg | ORAL_TABLET | Freq: Every day | ORAL | 3 refills | Status: DC

## 2024-08-01 NOTE — Telephone Encounter (Signed)
-----   Message from Lamar Fitch sent at 07/02/2024 12:33 PM EDT ----- Cholesterol still not well-controlled, please double the dose of Lipitor to 40 mg daily, fasting lipid profile, AST ALT in 6 weeks ----- Message ----- From: Interface, [facility] Lab Results In Sent: 06/28/2024   5:37 AM EDT To: Lamar JINNY Fitch, MD

## 2024-08-01 NOTE — Telephone Encounter (Signed)
 MyChart message

## 2024-08-02 NOTE — Telephone Encounter (Signed)
-----   Message from Lamar Fitch sent at 07/27/2024  4:48 PM EDT ----- Echocardiogram showed normal left ventricle ejection fraction mild mitral valve regurgitation, overall looks good ----- Message ----- From: Interface, [facility] Sent: 07/23/2024   1:56 PM EDT To: Lamar JINNY Fitch, MD

## 2024-08-05 NOTE — Progress Notes (Unsigned)
 This encounter was created in error - please disregard.

## 2024-08-06 ENCOUNTER — Ambulatory Visit: Payer: Self-pay | Attending: Cardiology | Admitting: Cardiology

## 2024-08-07 ENCOUNTER — Telehealth: Payer: Self-pay | Admitting: *Deleted

## 2024-08-07 NOTE — Progress Notes (Unsigned)
 Complex Care Management Care Guide Note  08/07/2024 Name: [name] MRN: [id number] DOB: [birth year]  Kenneth Marsh is a 53 y.o. year old male who is a primary care patient of Lorren Greig PARAS, NP and is actively engaged with the care management team. I reached out to Beulah Lana Jubilee by phone today to assist with re-scheduling  with the RN Case Manager.  Follow up plan: Unsuccessful telephone outreach attempt made. A HIPAA compliant phone message was left for the patient providing contact information and requesting a return call.  Thedford Franks, CMA [place]  [facility], [facility] Guide Direct Dial: [phone]  Fax: [phone] Website: [email or web address]

## 2024-08-09 NOTE — Progress Notes (Signed)
 Complex Care Management Care Guide Note  08/09/2024 Name: Kenneth Marsh MRN: [id number] DOB: Mar 06, 1971  Kenneth Marsh is a 53 y.o. year old male who is a primary care patient of Lorren Greig PARAS, NP and is actively engaged with the care management team. I reached out to Beulah Lana Jubilee by phone today to assist with re-scheduling  with the RN Case Manager.  Follow up plan: Telephone appointment with complex care management team member scheduled for:  08/14/2024  Thedford Franks, CMA [place]  [facility], [facility] Guide Direct Dial: [phone]  Fax: [phone] Website: [email or web address]

## 2024-08-14 ENCOUNTER — Other Ambulatory Visit: Payer: Self-pay | Admitting: *Deleted

## 2024-08-14 NOTE — Patient Instructions (Signed)
 Visit Information  Thank you for taking time to visit with me today. Please don't hesitate to contact me if I can be of assistance to you before our next scheduled appointment.  Our next appointment is by telephone on 09/10/2024 at 2:00 pm Please call the care guide team at [phone] if you need to cancel or reschedule your appointment.  -Patient reports copy of his medical records. RNCM provided pt with the number to [facility] for medical records to address this request. No additional needs presented at this time.   Following is a copy of your care plan:   Goals Addressed             This Visit's Progress    VBCI RN Care Plan-Atrial flutter       Problems:  Knowledge Deficits related to Atrial Flutter  Goal: Over the next 90 days the Patient will demonstrate understanding of rationale for each prescribed medication as evidenced by chart review and patient report    take all medications exactly as prescribed and will call provider for medication related questions as evidenced by chart review and patient report    verbalize basic understanding of Atrial Fibrillation/Flutter disease process and self health management plan as evidenced by chart review and patient report  Interventions:   AFIB Interventions:   Counseled on increased risk of stroke due to Afib and benefits of anticoagulation for stroke prevention Reviewed importance of adherence to anticoagulant exactly as prescribed Counseled on bleeding risk associated with Eliquis  and importance of self-monitoring for signs/symptoms of bleeding Counseled on avoidance of NSAIDs due to increased bleeding risk with anticoagulants Counseled on importance of regular laboratory monitoring as prescribed Counseled on seeking medical attention after a head injury or if there is blood in the urine/stool Afib action plan reviewed with provided information on Atrial Flutter in Spanish. Screening for signs and symptoms of depression  related to chronic disease state  Assessed social determinant of health barriers   Patient Self-Care Activities:  Attend all scheduled provider appointments Attend church or other social activities Call pharmacy for medication refills 3-7 days in advance of running out of medications Call provider office for new concerns or questions  Perform all self care activities independently  Perform IADL's (shopping, preparing meals, housekeeping, managing finances) independently Take medications as prescribed   begin a symptom diary bring symptom diary to all appointments check pulse (heart) rate before taking medicine check pulse (heart) rate once a day cut down alcohol use make a plan to exercise regularly make a plan to eat healthy keep all lab appointments stop my alcohol use take medicine as prescribed  Plan:  Telephone follow up appointment with care management team member scheduled for:  09/10/2024 @ 2:00 pm             Please call the Suicide and Crisis Lifeline: 988 call the [place]  National Suicide Prevention Lifeline: 212-624-4533 or TTY: (330)148-9282 TTY 607-102-4152) to talk to a trained counselor call 1-800-273-TALK (toll free, 24 hour hotline) if you are experiencing a Mental Health or Behavioral Health Crisis or need someone to talk to.  Patient verbalizes understanding of instructions and care plan provided today and agrees to view in MyChart. Active MyChart status and patient understanding of how to access instructions and care plan via MyChart confirmed with patient.      Olam Ku, RN, BSN [place]  [facility], [facility] Health RN Care Manager Direct Dial: [phone]  Fax: [phone]

## 2024-08-14 NOTE — Patient Outreach (Addendum)
 Complex Care Management   Visit Note  08/14/2024  Name:  Kenneth Marsh MRN: [id number] DOB: Jul 04, 1971  Situation: Referral received for Complex Care Management related to Atrial Flutter I obtained verbal consent from Patient.  Visit completed with Patient  on the phone  Background:   Past Medical History:  Diagnosis Date   Atrial flutter (HCC)    Diabetes mellitus, type 2 (HCC) 09/30/2021   Hyperlipidemia 09/30/2021   Primary hypertension 09/29/2021    Assessment: Patient Reported Symptoms:  Cognitive Cognitive Status: Able to follow simple commands, Normal speech and language skills, Alert and oriented to person, place, and time   Health Maintenance Behaviors: Annual physical exam  Neurological Neurological Review of Symptoms: No symptoms reported    HEENT HEENT Symptoms Reported: No symptoms reported      Cardiovascular Cardiovascular Symptoms Reported: No symptoms reported Does patient have uncontrolled Hypertension?: Yes Is patient checking Blood Pressure at home?: Yes Patient's Recent BP reading at home: last night reports reading at 133/78 asymptomatic with HR at 56-64 Cardiovascular Management Strategies: Routine screening, Medication therapy Cardiovascular Self-Management Outcome: 4 (good) Cardiovascular Comment: Patient has a hsitory of Atrial Flutter current on treatment plan and medication therapy.  Respiratory Respiratory Symptoms Reported: No symptoms reported Respiratory Management Strategies: Routine screening  Endocrine Endocrine Symptoms Reported: No symptoms reported Is patient diabetic?: Yes Is patient checking blood sugars at home?: Yes List most recent blood sugar readings, include date and time of day: Fasting glucose this AM at 109 ranging to 116 Endocrine Self-Management Outcome: 4 (good)  Gastrointestinal Gastrointestinal Symptoms Reported: No symptoms reported      Genitourinary Genitourinary Symptoms Reported: No symptoms  reported Genitourinary Management Strategies: Medication therapy  Integumentary Integumentary Symptoms Reported: No symptoms reported Skin Management Strategies: Medication therapy, Routine screening  Musculoskeletal Musculoskelatal Symptoms Reviewed: No symptoms reported Musculoskeletal Management Strategies: Routine screening Musculoskeletal Self-Management Outcome: 4 (good) Falls in the past year?: No Number of falls in past year: 1 or less Was there an injury with Fall?: No Fall Risk Category Calculator: 0 Patient Fall Risk Level: Low Fall Risk Patient at Risk for Falls Due to: No Fall Risks  Psychosocial Psychosocial Symptoms Reported: No symptoms reported     Quality of Family Relationships: helpful, involved, supportive Do you feel physically threatened by others?: No    08/14/2024    PHQ2-9 Depression Screening   Little interest or pleasure in doing things Not at all  Feeling down, depressed, or hopeless Not at all  PHQ-2 - Total Score 0  Trouble falling or staying asleep, or sleeping too much    Feeling tired or having little energy    Poor appetite or overeating     Feeling bad about yourself - or that you are a failure or have let yourself or your family down    Trouble concentrating on things, such as reading the newspaper or watching television    Moving or speaking so slowly that other people could have noticed.  Or the opposite - being so fidgety or restless that you have been moving around a lot more than usual    Thoughts that you would be better off dead, or hurting yourself in some way    PHQ2-9 Total Score    If you checked off any problems, how difficult have these problems made it for you to do your work, take care of things at home, or get along with other people    Depression Interventions/Treatment      Vitals:  08/14/24 1344  BP: 133/78  Pulse: 64    Medications Reviewed Today     Reviewed by Alvia Olam BIRCH, RN (Registered Nurse) on 08/14/24 at  1311  Med List Status: <None>   Medication Order Taking? Sig Documenting Provider Last Dose Status Informant  albuterol  (VENTOLIN  HFA) 108 (90 Base) MCG/ACT inhaler [id number] Yes Inhale 2 puffs into the lungs every 6 (six) hours as needed for wheezing or shortness of breath. Lorren Greig PARAS, NP  Active   amiodarone  (PACERONE ) 200 MG tablet [id number] Yes Take 1 tablet (200 mg total) by mouth daily. Krasowski, Robert J, MD  Active   amLODipine  (NORVASC ) 5 MG tablet [id number] Yes Take 1 tablet (5 mg total) by mouth daily. Lorren Greig PARAS, NP  Active   apixaban  (ELIQUIS ) 5 MG TABS tablet [id number] Yes Take 1 tablet (5 mg total) by mouth 2 (two) times daily. Newlin, Enobong, MD  Active   atorvastatin  (LIPITOR) 40 MG tablet [id number] Yes Take 1 tablet (40 mg total) by mouth daily. Krasowski, Robert J, MD  Active   metFORMIN  (GLUCOPHAGE -XR) 500 MG 24 hr tablet [id number] Yes Take 1 tablet (500 mg total) by mouth 2 (two) times daily with a meal. Lorren Greig PARAS, NP  Active   metoprolol  succinate (TOPROL -XL) 50 MG 24 hr tablet [id number] Yes Take 1 tablet (50 mg total) by mouth 2 (two) times daily. Take with or immediately following a meal. Krasowski, Robert J, MD  Active   Med List Note Beverlee Reyes ORN, CPhT 07/12/14 0204): Doesn't have a pharmacy he uses regularly             Recommendation:   PCP Follow-up Continue Current Plan of Care  Follow Up Plan:   Telephone follow up appointment date/time:  09/10/2024 @ 2:00 pm   Olam Alvia, RN, BSN [place]  [facility], [facility] RN Care Manager Direct Dial: [phone]  Fax: [phone]

## 2024-08-20 ENCOUNTER — Ambulatory Visit: Payer: Self-pay | Attending: Cardiology | Admitting: Cardiology

## 2024-09-04 ENCOUNTER — Encounter: Payer: Self-pay | Admitting: *Deleted

## 2024-09-05 ENCOUNTER — Ambulatory Visit: Payer: Self-pay | Attending: Cardiology | Admitting: Cardiology

## 2024-09-05 ENCOUNTER — Encounter: Payer: Self-pay | Admitting: Cardiology

## 2024-09-05 VITALS — BP 130/98 | HR 70 | Ht 68.0 in | Wt 208.0 lb

## 2024-09-05 DIAGNOSIS — E785 Hyperlipidemia, unspecified: Secondary | ICD-10-CM

## 2024-09-05 DIAGNOSIS — I420 Dilated cardiomyopathy: Secondary | ICD-10-CM

## 2024-09-05 DIAGNOSIS — I10 Essential (primary) hypertension: Secondary | ICD-10-CM

## 2024-09-05 DIAGNOSIS — I4892 Unspecified atrial flutter: Secondary | ICD-10-CM

## 2024-09-05 MED ORDER — ATORVASTATIN CALCIUM 40 MG PO TABS
40.0000 mg | ORAL_TABLET | Freq: Every day | ORAL | 3 refills | Status: AC
Start: 2024-09-05 — End: ?

## 2024-09-05 NOTE — Progress Notes (Unsigned)
 Cardiology Office Note:    Date:  09/05/2024   ID:  Kenneth, Marsh 1970/12/20, MRN [id number]  PCP:  Kenneth Greig PARAS, NP  Cardiologist:  Kenneth Fitch, MD    Referring MD: Kenneth Greig PARAS, NP   No chief complaint on file. Doing well    History of Present Illness:    Kenneth Marsh is a 53 y.o. male past history significant for paroxysmal atrial flutter.  In June 2025 he required cardioversion from atrial flutter, he was fine also to have diminished ejection fraction 4045%, as well as new onset diabetes.  Put on amiodarone  since that time he is doing fine.  He comes today 2 months for follow-up after cardioversion he got repeated echocardiogram done which showed normalization of left ventricle ejection fraction.  He is doing well, he denies have any chest pain tightness squeezing pressure mid chest no palpitation dizziness swelling of lower extremities , he works in the [employer] and doing well  Past Medical History:  Diagnosis Date   Atrial flutter (HCC)    Diabetes mellitus, type 2 (HCC) 09/30/2021   Hyperlipidemia 09/30/2021   Primary hypertension 09/29/2021    Past Surgical History:  Procedure Laterality Date   CARDIOVERSION N/A 08/07/2018   Procedure: CARDIOVERSION;  Surgeon: Kenneth Ezra RAMAN, MD;  [facility];  Service: Cardiovascular;  Laterality: N/A;   TEE WITHOUT CARDIOVERSION N/A 08/07/2018   Procedure: TRANSESOPHAGEAL ECHOCARDIOGRAM (TEE);  Surgeon: Kenneth Ezra RAMAN, MD;  Location: [facility] ENDOSCOPY;  Service: Cardiovascular;  Laterality: N/A;    Current Medications: Current Meds  Medication Sig   albuterol  (VENTOLIN  HFA) 108 (90 Base) MCG/ACT inhaler Inhale 2 puffs into the lungs every 6 (six) hours as needed for wheezing or shortness of breath.   amiodarone  (PACERONE ) 200 MG tablet Take 1 tablet (200 mg total) by mouth daily.   amLODipine  (NORVASC ) 5 MG tablet Take 1 tablet (5 mg total) by mouth daily.   apixaban  (ELIQUIS ) 5 MG TABS  tablet Take 1 tablet (5 mg total) by mouth 2 (two) times daily.   atorvastatin  (LIPITOR) 40 MG tablet Take 1 tablet (40 mg total) by mouth daily.   metFORMIN  (GLUCOPHAGE -XR) 500 MG 24 hr tablet Take 1 tablet (500 mg total) by mouth 2 (two) times daily with a meal.   metoprolol  succinate (TOPROL -XL) 50 MG 24 hr tablet Take 1 tablet (50 mg total) by mouth 2 (two) times daily. Take with or immediately following a meal.     Allergies:   Patient has no known allergies.   Social History   Socioeconomic History   Marital status: Divorced    Spouse name: Not on file   Number of children: Not on file   Years of education: Not on file   Highest education level: Not on file  Occupational History   Not on file  Tobacco Use   Smoking status: Never    Passive exposure: Never   Smokeless tobacco: Never  Vaping Use   Vaping status: Never Used  Substance and Sexual Activity   Alcohol use: Not Currently   Drug use: No   Sexual activity: Yes  Other Topics Concern   Not on file  Social History Narrative   Not on file   Social Drivers of Health   Financial Resource Strain: Low Risk  (05/07/2024)   Overall Financial Resource Strain (CARDIA)    Difficulty of Paying Living Expenses: Not hard at all  Food Insecurity: No Food Insecurity (08/14/2024)   Hunger Vital  Sign    Worried About Programme researcher, broadcasting/film/video in the Last Year: Never true    Ran Out of Food in the Last Year: Never true  Transportation Needs: No Transportation Needs (08/14/2024)   PRAPARE - Administrator, Civil Service (Medical): No    Lack of Transportation (Non-Medical): No  Physical Activity: Inactive (05/07/2024)   Exercise Vital Sign    Days of Exercise per Week: 0 days    Minutes of Exercise per Session: 0 min  Stress: No Stress Concern Present (05/07/2024)   [employer] of Occupational Health - Occupational Stress Questionnaire    Feeling of Stress: Only a little  Social Connections: Unknown (03/25/2022)    Received from [facility]   Social Network    Social Network: Not on file     Family History: The patient's family history includes Hypertension in his father. ROS:   Please see the history of present illness.    All 14 point review of systems negative except as described per history of present illness  EKGs/Labs/Other Studies Reviewed:         Recent Labs: 09/21/2023: BUN 13; Creatinine, Ser 0.78; Potassium 4.1; Sodium 136 06/27/2024: ALT 40; TSH 2.910  Recent Lipid Panel    Component Value Date/Time   CHOL 130 06/27/2024 0921   TRIG 118 06/27/2024 0921   HDL 24 (L) 06/27/2024 0921   CHOLHDL 5.4 (H) 06/27/2024 0921   LDLCALC 84 06/27/2024 0921    Physical Exam:    VS:  BP (!) 130/98   Pulse 70   Ht 5' 8 (1.727 m)   Wt 208 lb (94.3 kg)   SpO2 96%   BMI 31.63 kg/m     Wt Readings from Last 3 Encounters:  09/05/24 208 lb (94.3 kg)  06/27/24 209 lb 6.4 oz (95 kg)  05/07/24 213 lb 9.6 oz (96.9 kg)     GEN:  Well nourished, well developed in no acute distress HEENT: Normal NECK: No JVD; No carotid bruits LYMPHATICS: No lymphadenopathy CARDIAC: RRR, no murmurs, no rubs, no gallops RESPIRATORY:  Clear to auscultation without rales, wheezing or rhonchi  ABDOMEN: Soft, non-tender, non-distended MUSCULOSKELETAL:  No edema; No deformity  SKIN: Warm and dry LOWER EXTREMITIES: no swelling NEUROLOGIC:  Alert and oriented x 3 PSYCHIATRIC:  Normal affect   ASSESSMENT:    1. Primary hypertension   2. Hyperlipidemia, unspecified hyperlipidemia type   3. Dilated cardiomyopathy (HCC)   4. Paroxysmal atrial flutter (HCC)   5. Dyslipidemia    PLAN:    In order of problems listed above:  Paroxysmal atrial flutter, maintained sinus rhythm on amiodarone  however he is too young to be on amiodarone  long-term.  Long-term solution for him is ablation he was referred to our EP team however missed his appointment we had long discussion about this he is ready to pursue this  route again so we will schedule him to have appoint with our EP colleagues to talk about ablation. Essential hypertension blood pressure well-controlled continue present management. Dyslipidemia due to [facility] which show me LDL 84 HDL 24 continue present management   Medication Adjustments/Labs and Tests Ordered: Current medicines are reviewed at length with the patient today.  Concerns regarding medicines are outlined above.  Orders Placed This Encounter  Procedures   EKG 12-Lead   Medication changes: No orders of the defined types were placed in this encounter.   Signed, Kenneth DOROTHA Fitch, MD, [facility] 09/05/2024 11:38 AM    [facility]

## 2024-09-05 NOTE — Patient Instructions (Signed)
 Medication Instructions:  Your physician recommends that you continue on your current medications as directed. Please refer to the Current Medication list given to you today.  *If you need a refill on your cardiac medications before your next appointment, please call your pharmacy*   Lab Work: None Ordered If you have labs (blood work) drawn today and your tests are completely normal, you will receive your results only by: MyChart Message (if you have MyChart) OR A paper copy in the mail If you have any lab test that is abnormal or we need to change your treatment, we will call you to review the results.   Testing/Procedures: None Ordered   Follow-Up: At [facility], you and your health needs are our priority.  As part of our continuing mission to provide you with exceptional heart care, we have created designated Provider Care Teams.  These Care Teams include your primary Cardiologist (physician) and Advanced Practice Providers (APPs -  Physician Assistants and Nurse Practitioners) who all work together to provide you with the care you need, when you need it.  We recommend signing up for the patient portal called "MyChart".  Sign up information is provided on this After Visit Summary.  MyChart is used to connect with patients for Virtual Visits (Telemedicine).  Patients are able to view lab/test results, encounter notes, upcoming appointments, etc.  Non-urgent messages can be sent to your provider as well.   To learn more about what you can do with MyChart, go to [email or web address].    Your next appointment:   6 month(s)  The format for your next appointment:   In Person  Provider:   Gypsy Balsam, MD    Other Instructions Appt with Dr. Elberta Fortis

## 2024-09-10 ENCOUNTER — Other Ambulatory Visit: Payer: Self-pay | Admitting: *Deleted

## 2024-09-10 DIAGNOSIS — E1165 Type 2 diabetes mellitus with hyperglycemia: Secondary | ICD-10-CM

## 2024-09-10 NOTE — Patient Instructions (Signed)
 Visit Information  Mr. Pigeon was given information about Medicaid Managed Care team care coordination services as a part of their [employer] benefit.   If you would like to schedule transportation through your [employer] [facility] plan, please call the following number at least 2 days in advance of your appointment: [phone]  If you are experiencing a behavioral health crisis, call the [employer] [place]  Behavioral Health Crisis Line at [phone]). The line is available 24 hours a day, seven days a week.    Please see education materials related to atrial flutter provided by MyChart link.  Care plan and visit instructions communicated with the patient verbally today. Patient agrees to receive a copy in MyChart. Active MyChart status and patient understanding of how to access instructions and care plan via MyChart confirmed with patient.     Telephone follow up appointment with Managed Medicaid care management team member scheduled for: 10/08/2024 @ 2:30 PM   Olam Ku, RN, BSN [place]  [facility], [facility] RN Care Manager Direct Dial: [phone]  Fax: [phone]

## 2024-09-10 NOTE — Patient Outreach (Signed)
 Complex Care Management   Visit Note  09/10/2024  Name:  Kenneth Marsh MRN: [id number] DOB: 1970/12/06  Situation: Referral received for Complex Care Management related to Atrial Flutter I obtained verbal consent from Patient.  Visit completed with Patient  on the phone  Background:   Past Medical History:  Diagnosis Date   Atrial flutter (HCC)    Diabetes mellitus, type 2 (HCC) 09/30/2021   Hyperlipidemia 09/30/2021   Primary hypertension 09/29/2021    Assessment: Patient Reported Symptoms:  Cognitive Cognitive Status: Able to follow simple commands, Alert and oriented to person, place, and time, Normal speech and language skills   Health Maintenance Behaviors: Annual physical exam  Neurological Neurological Review of Symptoms: No symptoms reported Neurological Management Strategies: Routine screening  HEENT HEENT Symptoms Reported: No symptoms reported HEENT Management Strategies: Routine screening    Cardiovascular Cardiovascular Symptoms Reported: No symptoms reported Does patient have uncontrolled Hypertension?: Yes Is patient checking Blood Pressure at home?: Yes Patient's Recent BP reading at home: Last reading reported 134/90 asymptomatic with HR 60-63 Cardiovascular Management Strategies: Routine screening Weight: 208 lb (94.3 kg) Cardiovascular Self-Management Outcome: 4 (good) Cardiovascular Comment: Ongoing hx of Atrial Flutter  Respiratory Respiratory Symptoms Reported: No symptoms reported Respiratory Management Strategies: Routine screening  Endocrine Endocrine Symptoms Reported: No symptoms reported Is patient diabetic?: Yes Is patient checking blood sugars at home?: Yes List most recent blood sugar readings, include date and time of day: Fasting glucose ranges 130-140 he remains asmyptomatic    Gastrointestinal Gastrointestinal Symptoms Reported: No symptoms reported      Genitourinary Genitourinary Symptoms Reported: No symptoms  reported Genitourinary Management Strategies: Medication therapy  Integumentary Integumentary Symptoms Reported: No symptoms reported Skin Management Strategies: Routine screening  Musculoskeletal Musculoskelatal Symptoms Reviewed: No symptoms reported Musculoskeletal Management Strategies: Routine screening      Psychosocial Psychosocial Symptoms Reported: No symptoms reported          There were no vitals filed for this visit.  Medications Reviewed Today     Reviewed by Alvia Olam BIRCH, RN (Registered Nurse) on 09/10/24 at 1416  Med List Status: <None>   Medication Order Taking? Sig Documenting Provider Last Dose Status Informant  albuterol  (VENTOLIN  HFA) 108 (90 Base) MCG/ACT inhaler [id number] Yes Inhale 2 puffs into the lungs every 6 (six) hours as needed for wheezing or shortness of breath. Lorren Greig PARAS, NP  Active   amiodarone  (PACERONE ) 200 MG tablet [id number] Yes Take 1 tablet (200 mg total) by mouth daily. Krasowski, Robert J, MD  Active   amLODipine  (NORVASC ) 5 MG tablet [id number] Yes Take 1 tablet (5 mg total) by mouth daily. Lorren Greig PARAS, NP  Active   apixaban  (ELIQUIS ) 5 MG TABS tablet [id number] Yes Take 1 tablet (5 mg total) by mouth 2 (two) times daily. Newlin, Enobong, MD  Active   atorvastatin  (LIPITOR) 40 MG tablet [id number] Yes Take 1 tablet (40 mg total) by mouth daily. Krasowski, Robert J, MD  Active   metFORMIN  (GLUCOPHAGE -XR) 500 MG 24 hr tablet [id number] Yes Take 1 tablet (500 mg total) by mouth 2 (two) times daily with a meal. Lorren Greig PARAS, NP  Active   metoprolol  succinate (TOPROL -XL) 50 MG 24 hr tablet [id number] Yes Take 1 tablet (50 mg total) by mouth 2 (two) times daily. Take with or immediately following a meal. Krasowski, Robert J, MD  Active   Med List Note Beverlee Reyes ORN, CPhT 07/12/14 0204): Doesn't have a pharmacy he uses regularly  Recommendation:   PCP Follow-up Continue Current Plan of Care  Follow Up Plan:    Telephone follow up appointment date/time:  10/08/2024 @ 2:30 pm   Olam Ku, RN, BSN [place]  [facility], [facility] RN Care Manager Direct Dial: [phone]  Fax: [phone]

## 2024-09-18 NOTE — Progress Notes (Signed)
 Sent to Primary Care. Keep all scheduled appointments.

## 2024-10-01 ENCOUNTER — Ambulatory Visit: Payer: Self-pay | Attending: Cardiology | Admitting: Cardiology

## 2024-10-01 ENCOUNTER — Encounter: Payer: Self-pay | Admitting: Cardiology

## 2024-10-01 VITALS — BP 140/100 | HR 58 | Ht 68.0 in | Wt 208.0 lb

## 2024-10-01 DIAGNOSIS — I10 Essential (primary) hypertension: Secondary | ICD-10-CM

## 2024-10-01 DIAGNOSIS — Z01812 Encounter for preprocedural laboratory examination: Secondary | ICD-10-CM

## 2024-10-01 DIAGNOSIS — I483 Typical atrial flutter: Secondary | ICD-10-CM

## 2024-10-01 NOTE — Progress Notes (Signed)
  Electrophysiology Office Note:   Date:  10/01/2024  ID:  Kenneth Marsh, Kenneth Marsh 26-Feb-1971, MRN [id number]  Primary Cardiologist: None Primary Heart Failure: None Electrophysiologist: Klint Lezcano Gladis Norton, MD      History of Present Illness:   Kenneth Marsh is a 53 y.o. male with h/o atrial flutter, diabetes, hyperlipidemia, hypertension seen today for  for Electrophysiology evaluation of atrial flutter at the request of Lamar Fitch.    He feels well.  He has been on amiodarone  for a few years.  He has not had any recurrence of his atrial flutters.  Despite this, he is interested in alternative rhythm control strategies.  He is concerned about his age and long-term amiodarone .  He has had no further arrhythmias.  Review of systems complete and found to be negative unless listed in HPI.   EP Information / Studies Reviewed:    EKG is not ordered today. EKG from 09/05/2024 reviewed which showed sinus rhythm       Risk Assessment/Calculations:    CHA2DS2-VASc Score = 2   This indicates a 2.2% annual risk of stroke. The patient's score is based upon: CHF History: 0 HTN History: 1 Diabetes History: 1 Stroke History: 0 Vascular Disease History: 0 Age Score: 0 Gender Score: 0            Physical Exam:   VS:  BP (!) 140/100 (BP Location: Left Arm, Patient Position: Sitting, Cuff Size: Normal)   Pulse (!) 58   Ht 5' 8 (1.727 m)   Wt 208 lb (94.3 kg)   SpO2 96%   BMI 31.63 kg/m    Wt Readings from Last 3 Encounters:  10/01/24 208 lb (94.3 kg)  09/10/24 208 lb (94.3 kg)  09/05/24 208 lb (94.3 kg)     GEN: Well nourished, well developed in no acute distress NECK: No JVD; No carotid bruits CARDIAC: Regular rate and rhythm, no murmurs, rubs, gallops RESPIRATORY:  Clear to auscultation without rales, wheezing or rhonchi  ABDOMEN: Soft, non-tender, non-distended EXTREMITIES:  No edema; No deformity   ASSESSMENT AND PLAN:    1.  Typical atrial flutter: On  amiodarone .  Based on his age of 53, he would benefit from alternative rhythm control.  He would prefer to avoid long-term amiodarone .  Due to that, we Wynee Matarazzo plan for ablation.  Risks and benefits have been discussed.  Risk include bleeding, tamponade, heart block, stroke, vascular damage, MI, death, renal failure.  He understands these risks and has agreed to the procedure.  2.  Hypertension: Mildly elevated.  Plan per primary care and primary cardiology  3.  Secondary hypercoagulable state: On Eliquis   Follow up with EP Team as usual post procedure  Signed, Demetries Coia Gladis Norton, MD

## 2024-10-01 NOTE — Patient Instructions (Signed)
 Medication Instructions:  Your physician recommends that you continue on your current medications as directed. Please refer to the Current Medication list given to you today.  *If you need a refill on your cardiac medications before your next appointment, please call your pharmacy*  Lab Work: None ordered  If you have any lab test that is abnormal or we need to change your treatment, we will call you to review the results.  Testing/Procedures: Your physician has recommended that you have an ablation. Catheter ablation is a medical procedure used to treat some cardiac arrhythmias (irregular heartbeats). During catheter ablation, a long, thin, flexible tube is put into a blood vessel in your groin (upper thigh), or neck. This tube is called an ablation catheter. It is then guided to your heart through the blood vessel. Radio frequency waves destroy small areas of heart tissue where abnormal heartbeats may cause an arrhythmia to start.  You will be scheduled for 12/21/2024, we will be in touch as it gets closer.  We will send your instructions via mychart.   Follow-Up: At [facility], you and your health needs are our priority.  As part of our continuing mission to provide you with exceptional heart care, our providers are all part of one team.  This team includes your primary Cardiologist (physician) and Advanced Practice Providers or APPs (Physician Assistants and Nurse Practitioners) who all work together to provide you with the care you need, when you need it.  Your next appointment:   1 month(s)  Provider:   You will see one of the following Advanced Practice Providers on your designated Care Team:   Charlies Arthur, PA-C Michael Andy Tillery, PA-C Suzann Riddle, NP Daphne Barrack, NP Artist Pouch, PA-C    Thank you for choosing [facility]!!   Maeola Domino, RN [phone]

## 2024-10-08 ENCOUNTER — Encounter: Payer: Self-pay | Admitting: *Deleted

## 2024-10-08 ENCOUNTER — Telehealth: Payer: Self-pay | Admitting: *Deleted

## 2024-10-08 NOTE — Patient Instructions (Signed)
 Kenneth Marsh - I am sorry I was unable to reach you today for our scheduled appointment. I work with Jaycee Greig PARAS, NP and am calling to support your healthcare needs. Please contact me at [phone] at your earliest convenience. I look forward to speaking with you soon.   Thank you,   Olam Ku, RN, BSN [place]  [facility], [facility] RN Care Manager Direct Dial: [phone]  Fax: [phone]

## 2024-10-09 NOTE — Addendum Note (Signed)
 Addended by: GRETEL MAEOLA CROME on: 10/09/2024 08:15 AM   Modules accepted: Orders

## 2024-10-12 ENCOUNTER — Other Ambulatory Visit: Payer: Self-pay | Admitting: *Deleted

## 2024-10-12 NOTE — Patient Outreach (Signed)
 Complex Care Management   Visit Note  10/12/2024  Name:  Kenneth Marsh MRN: [id number] DOB: [birth year]  Situation: Referral received for Complex Care Management related to Atrial Flutter I obtained verbal consent from Patient.  Visit completed with Patient  on the phone  Background:   Past Medical History:  Diagnosis Date   Atrial flutter (HCC)    Diabetes mellitus, type 2 (HCC) 09/30/2021   Hyperlipidemia 09/30/2021   Primary hypertension 09/29/2021    Assessment: Patient Reported Symptoms:  Cognitive Cognitive Status: Able to follow simple commands, Alert and oriented to person, place, and time, Normal speech and language skills   Health Maintenance Behaviors: Annual physical exam  Neurological Neurological Review of Symptoms: No symptoms reported Neurological Management Strategies: Routine screening  HEENT HEENT Symptoms Reported: No symptoms reported HEENT Management Strategies: Routine screening    Cardiovascular Cardiovascular Symptoms Reported: No symptoms reported Does patient have uncontrolled Hypertension?: Yes Is patient checking Blood Pressure at home?: Yes Patient's Recent BP reading at home: Reports last reading 10/11/2024 135/88 asympatomatic and HR 59 Cardiovascular Management Strategies: Routine screening Weight: 208 lb (94.3 kg) Cardiovascular Self-Management Outcome: 4 (good) Cardiovascular Comment: Ongoing hx of Atrial Flutter pending Ablation on 12/21/2024  Respiratory Respiratory Symptoms Reported: No symptoms reported Additional Respiratory Details: Inhaler is needed Respiratory Management Strategies: Routine screening  Endocrine Endocrine Symptoms Reported: No symptoms reported Is patient diabetic?: Yes Is patient checking blood sugars at home?: Yes List most recent blood sugar readings, include date and time of day: post meal reading was 112 and he remains asymptomatic Endocrine Self-Management Outcome: 4 (good)  Gastrointestinal  Gastrointestinal Symptoms Reported: No symptoms reported      Genitourinary Genitourinary Symptoms Reported: No symptoms reported    Integumentary Integumentary Symptoms Reported: No symptoms reported Skin Management Strategies: Routine screening  Musculoskeletal Musculoskelatal Symptoms Reviewed: No symptoms reported Musculoskeletal Management Strategies: Routine screening Musculoskeletal Self-Management Outcome: 4 (good)      Psychosocial Psychosocial Symptoms Reported: No symptoms reported           Today's Vitals   10/12/24 1106  Weight: 208 lb (94.3 kg)   Pain Scale: 0-10 Pain Score: 0-No pain  Medications Reviewed Today     Reviewed by Alvia Kenneth BIRCH, RN (Registered Nurse) on 10/12/24 at 1104  Med List Status: <None>   Medication Order Taking? Sig Documenting Provider Last Dose Status Informant  albuterol  (VENTOLIN  HFA) 108 (90 Base) MCG/ACT inhaler [id number] Yes Inhale 2 puffs into the lungs every 6 (six) hours as needed for wheezing or shortness of breath. Kenneth Marsh  Active   amiodarone  (PACERONE ) 200 MG tablet [id number] Yes Take 1 tablet (200 mg total) by mouth daily. Kenneth Marsh  Active   amLODipine  (NORVASC ) 5 MG tablet [id number] Yes Take 1 tablet (5 mg total) by mouth daily. Kenneth Marsh  Active   apixaban  (ELIQUIS ) 5 MG TABS tablet [id number] Yes Take 1 tablet (5 mg total) by mouth 2 (two) times daily. Newlin, Enobong, Marsh  Active   atorvastatin  (LIPITOR) 40 MG tablet [id number] Yes Take 1 tablet (40 mg total) by mouth daily. Kenneth Marsh  Active   metFORMIN  (GLUCOPHAGE -XR) 500 MG 24 hr tablet [id number] Yes Take 1 tablet (500 mg total) by mouth 2 (two) times daily with a meal. Kenneth Marsh  Active   metoprolol  succinate (TOPROL -XL) 50 MG 24 hr tablet [id number] Yes Take 1 tablet (50 mg total) by mouth 2 (two) times daily. Take with or  immediately following a meal. Kenneth Marsh  Active   Med List Note Kenneth Marsh 07/12/14 0204): Doesn't have a pharmacy he uses regularly             Recommendation:   PCP Follow-up Continue Current Plan of Care  Follow Up Plan:   Telephone follow up appointment date/time:  11/05/2024 @ 2:30 pm   Kenneth Ku, RN, BSN [place]  [facility], [facility] RN Care Manager Direct Dial: [phone]  Fax: [phone]

## 2024-10-12 NOTE — Patient Instructions (Signed)
 Visit Information  Thank you for taking time to visit with me today. Please don't hesitate to contact me if I can be of assistance to you before our next scheduled appointment.  Your next care management appointment is by telephone on 11/05/2024  at 2:30 pm  Please call the care guide team at [phone] if you need to cancel, schedule, or reschedule an appointment.   Please call the Suicide and Crisis Lifeline: 988 call the [place]  National Suicide Prevention Lifeline: (540)530-5871 or TTY: 661-007-3399 TTY 906-723-1883) to talk to a trained counselor call 1-800-273-TALK (toll free, 24 hour hotline) if you are experiencing a Mental Health or Behavioral Health Crisis or need someone to talk to.   Olam Ku, RN, BSN [place]  [facility], [facility] Health RN Care Manager Direct Dial: [phone]  Fax: [phone]

## 2024-11-05 ENCOUNTER — Telehealth: Payer: Self-pay | Admitting: *Deleted

## 2024-11-05 ENCOUNTER — Encounter: Payer: Self-pay | Admitting: *Deleted

## 2024-11-05 NOTE — Patient Instructions (Signed)
 Jaekwon Curran Lenderman - I am sorry I was unable to reach you today for our scheduled appointment. I work with Jaycee Greig PARAS, NP and am calling to support your healthcare needs. Please contact me at [phone] at your earliest convenience. I look forward to speaking with you soon.   Thank you,   Olam Ku, RN, BSN [place]  [facility], [facility] RN Care Manager Direct Dial: [phone]  Fax: [phone]

## 2024-11-12 ENCOUNTER — Telehealth: Payer: Self-pay | Admitting: Cardiology

## 2024-11-12 MED ORDER — METOPROLOL SUCCINATE ER 50 MG PO TB24: 50.0000 mg | ORAL_TABLET | Freq: Two times a day (BID) | ORAL | 2 refills | Status: AC

## 2024-11-12 NOTE — Telephone Encounter (Signed)
" °*  STAT* If patient is at the pharmacy, call can be transferred to refill team.   1. Which medications need to be refilled? (please list name of each medication and dose if known)   metoprolol  succinate (TOPROL -XL) 50 MG 24 hr tablet     2. Would you like to learn more about the convenience, safety, & potential cost savings by using the [facility]? No    3. Are you open to using the [facility] ([facility]. No    4. Which pharmacy/location (including street and city if local pharmacy) is medication to be sent to? [facility] [address], [place] - [address]     5. Do they need a 30 day or 90 day supply? 90 day   Pt is out of medication.   "

## 2024-11-12 NOTE — Telephone Encounter (Signed)
 Refill of Metoprolol  Succinate 50 mg sent to [facility] in [place].

## 2024-11-21 ENCOUNTER — Other Ambulatory Visit: Payer: Self-pay | Admitting: *Deleted

## 2024-11-21 NOTE — Patient Instructions (Signed)
 Visit Information  Thank you for taking time to visit with me today. Please don't hesitate to contact me if I can be of assistance to you before our next scheduled appointment.  Your next care management appointment is by telephone on 12/21/2024 at 2:00 PM with Warren Quivers, [facility]  Please call the care guide team at [phone] if you need to cancel, schedule, or reschedule an appointment.   Please call the Suicide and Crisis Lifeline: 988 call the [place]  National Suicide Prevention Lifeline: 480-147-0894 or TTY: (409)005-2528 TTY (339)107-2302) to talk to a trained counselor call 1-800-273-TALK (toll free, 24 hour hotline) if you are experiencing a Mental Health or Behavioral Health Crisis or need someone to talk to.   Olam Ku, RN, BSN [place]  [facility] RN Care Manager Direct Dial: [phone]  Fax: [phone]

## 2024-11-21 NOTE — Patient Outreach (Signed)
 Complex Care Management   Visit Note  11/21/2024  Name:  Kenneth Marsh MRN: [id number] DOB: [birth year]  Situation: Referral received for Complex Care Management related to Atrial Flutter. I obtained verbal consent from Patient.  Visit completed with Patient  on the phone  Background:   Past Medical History:  Diagnosis Date   Atrial flutter (HCC)    Diabetes mellitus, type 2 (HCC) 09/30/2021   Hyperlipidemia 09/30/2021   Primary hypertension 09/29/2021    Assessment: Pt pending atrial flutter Ablation on 12/21/2024 Patient Reported Symptoms:  Cognitive Cognitive Status: Able to follow simple commands, Alert and oriented to person, place, and time, Normal speech and language skills   Health Maintenance Behaviors: Annual physical exam Health Facilitated by: Rest  Neurological Neurological Review of Symptoms: No symptoms reported Neurological Management Strategies: Coping strategies, Routine screening Neurological Self-Management Outcome: 4 (good)  HEENT HEENT Symptoms Reported: No symptoms reported HEENT Management Strategies: Coping strategies HEENT Self-Management Outcome: 4 (good)    Cardiovascular Cardiovascular Symptoms Reported: No symptoms reported Does patient have uncontrolled Hypertension?: Yes Is patient checking Blood Pressure at home?: Yes Patient's Recent BP reading at home: Reports last reading 11/20/2024 was 137/83 and pt remains asymptomatic Cardiovascular Management Strategies: Coping strategies, Routine screening Weight: 208 lb (94.3 kg) Cardiovascular Self-Management Outcome: 4 (good) Cardiovascular Comment: Ongoing hx of Atrial Flutter pending Ablation on 12/21/2024  Respiratory Respiratory Symptoms Reported: No symptoms reported Additional Respiratory Details: Uses an inhaler when needed Respiratory Management Strategies: Coping strategies, Routine screening, Medication therapy Respiratory Self-Management Outcome: 4 (good)  Endocrine Endocrine Symptoms  Reported: No symptoms reported Is patient diabetic?: Yes Is patient checking blood sugars at home?: Yes List most recent blood sugar readings, include date and time of day: Reports his BG ranges rom 126-127 and he continue to be asymptomatic and adherent with all medications Endocrine Self-Management Outcome: 4 (good)  Gastrointestinal Gastrointestinal Symptoms Reported: No symptoms reported Gastrointestinal Management Strategies: Coping strategies Gastrointestinal Self-Management Outcome: 4 (good)    Genitourinary Genitourinary Symptoms Reported: No symptoms reported Genitourinary Management Strategies: Coping strategies Genitourinary Self-Management Outcome: 4 (good)  Integumentary Integumentary Symptoms Reported: No symptoms reported Skin Management Strategies: Coping strategies, Routine screening Skin Self-Management Outcome: 4 (good)  Musculoskeletal Musculoskelatal Symptoms Reviewed: No symptoms reported Musculoskeletal Management Strategies: Coping strategies, Routine screening Musculoskeletal Self-Management Outcome: 4 (good)      Psychosocial Psychosocial Symptoms Reported: No symptoms reported Behavioral Management Strategies: Coping strategies Behavioral Health Self-Management Outcome: 4 (good) Major Change/Loss/Stressor/Fears (CP): Denies Techniques to Cope with Loss/Stress/Change: None Quality of Family Relationships: helpful, involved, supportive Do you feel physically threatened by others?: No     Today's Vitals   11/21/24 1339  Weight: 208 lb (94.3 kg)   Pain Scale: 0-10 Pain Score: 0-No pain  Medications Reviewed Today     Reviewed by Alvia Olam BIRCH, RN (Registered Nurse) on 11/21/24 at 1329  Med List Status: <None>   Medication Order Taking? Sig Documenting Provider Last Dose Status Informant  albuterol  (VENTOLIN  HFA) 108 (90 Base) MCG/ACT inhaler [id number] Yes Inhale 2 puffs into the lungs every 6 (six) hours as needed for wheezing or shortness of  breath. Jaycee Greig PARAS, NP  Active   amiodarone  (PACERONE ) 200 MG tablet [id number] Yes Take 1 tablet (200 mg total) by mouth daily. Krasowski, Robert J, MD  Active   amLODipine  (NORVASC ) 5 MG tablet [id number] Yes Take 1 tablet (5 mg total) by mouth daily. Jaycee Greig PARAS, NP  Active   apixaban  (ELIQUIS ) 5 MG TABS tablet [id number]  Yes Take 1 tablet (5 mg total) by mouth 2 (two) times daily. Newlin, Enobong, MD  Active   atorvastatin  (LIPITOR) 40 MG tablet [id number] Yes Take 1 tablet (40 mg total) by mouth daily. Krasowski, Robert J, MD  Active   metFORMIN  (GLUCOPHAGE -XR) 500 MG 24 hr tablet [id number] Yes Take 1 tablet (500 mg total) by mouth 2 (two) times daily with a meal. Jaycee Greig PARAS, NP  Active   metoprolol  succinate (TOPROL -XL) 50 MG 24 hr tablet [id number] Yes Take 1 tablet (50 mg total) by mouth 2 (two) times daily. Take with or immediately following a meal. Krasowski, Robert J, MD  Active   Med List Note Beverlee Reyes ORN, CPhT 07/12/14 0204): Doesn't have a pharmacy he uses regularly             Recommendation:   PCP Follow-up Continue Current Plan of Care  Follow Up Plan:   Telephone follow up appointment date/time:  12/21/2024 @ 2:00 pm with Warren Quivers, RNCM   Olam Ku, RN, BSN [place]  [facility], [facility] Health RN Care Manager Direct Dial: [phone]  Fax: [phone]

## 2024-11-28 ENCOUNTER — Telehealth (HOSPITAL_COMMUNITY): Payer: Self-pay

## 2024-11-28 ENCOUNTER — Encounter (HOSPITAL_COMMUNITY): Payer: Self-pay

## 2024-11-28 NOTE — Telephone Encounter (Signed)
 Attempted to reach patient x attempts via [employer], [place] [phone], to discuss upcoming procedure. States call would not go through. Phone number confirmed.

## 2024-12-18 NOTE — Telephone Encounter (Signed)
 Call placed via language interpreter, [place] [phone]. Attempted to reach patient to discuss upcoming procedure, no answer. Left VM for patient to return call.

## 2024-12-19 NOTE — Telephone Encounter (Signed)
 Pt informed that he has [employer] and will need to be referred to [facility].  Procedure cancelled at [facility]. Aware I will discuss with Dr Inocencio next week and let him know soon about referral. Patient verbalized understanding and agreeable to plan.

## 2024-12-21 ENCOUNTER — Telehealth: Payer: Self-pay

## 2024-12-21 ENCOUNTER — Other Ambulatory Visit: Payer: Self-pay

## 2024-12-21 ENCOUNTER — Ambulatory Visit (HOSPITAL_COMMUNITY): Admission: RE | Admit: 2024-12-21 | Payer: Self-pay | Source: Home / Self Care | Admitting: Cardiology

## 2024-12-21 ENCOUNTER — Encounter (HOSPITAL_COMMUNITY): Admission: RE | Payer: Self-pay | Source: Home / Self Care

## 2024-12-21 NOTE — Patient Instructions (Signed)
 Visit Information  Thank you for taking time to visit with me today. Please don't hesitate to contact me if I can be of assistance to you before our next scheduled appointment.  Your next care management appointment is by telephone on 01/17/25 at 10 am  Patient will be following up with provider this week due to ablation being cancelled (insurance problem).  Please call the care guide team at [phone] if you need to cancel, schedule, or reschedule an appointment.   Please call the Suicide and Crisis Lifeline: 988 call the [place]  National Suicide Prevention Lifeline: (907)450-7410 or TTY: (832)068-1316 TTY (510)476-1803) to talk to a trained counselor call 1-800-273-TALK (toll free, 24 hour hotline) if you are experiencing a Mental Health or Behavioral Health Crisis or need someone to talk to.  Warren Quivers RN CM Population Health-Complex Care Management [facility] [phone]

## 2024-12-21 NOTE — Patient Outreach (Signed)
 Complex Care Management   Visit Note  12/21/2024  Name:  Kenneth Marsh MRN: [id number] DOB: 11-Jun-1971  Situation: Referral received for Complex Care Management related to Atrial Flutter and DMII. I obtained verbal consent from Patient.  Visit completed with Patient  on the phone  Background:   Past Medical History:  Diagnosis Date   Atrial flutter (HCC)    Diabetes mellitus, type 2 (HCC) 09/30/2021   Hyperlipidemia 09/30/2021   Primary hypertension 09/29/2021    Assessment: Patient Reported Symptoms:  Cognitive Cognitive Status: No symptoms reported, Alert and oriented to person, place, and time, Normal speech and language skills Cognitive/Intellectual Conditions Management [RPT]: None reported or documented in medical history or problem list   Health Maintenance Behaviors: Annual physical exam Healing Pattern: Average Health Facilitated by: Healthy diet, Rest, Stress management  Neurological Neurological Review of Symptoms: No symptoms reported Neurological Management Strategies: Routine screening, Coping strategies Neurological Self-Management Outcome: 4 (good)  HEENT HEENT Symptoms Reported: No symptoms reported HEENT Management Strategies: Coping strategies, Routine screening HEENT Self-Management Outcome: 4 (good)    Cardiovascular Cardiovascular Symptoms Reported: No symptoms reported Does patient have uncontrolled Hypertension?: Yes Is patient checking Blood Pressure at home?: Yes Patient's Recent BP reading at home: Patient reports that he is checking his BP daily. Reported today 132/86 Cardiovascular Management Strategies: Medication therapy, Routine screening, Coping strategies Weight: 206 lb (93.4 kg) Cardiovascular Self-Management Outcome: 4 (good) Cardiovascular Comment: Ablation for 12/21/24 was cancelled. Patient states he will be following up with his provider for next steps. States he has an appointment on Monday 12/24/24. No symptoms today at time of call.   Respiratory Respiratory Symptoms Reported: No symptoms reported Respiratory Management Strategies: Routine screening Respiratory Self-Management Outcome: 4 (good)  Endocrine Endocrine Symptoms Reported: No symptoms reported Is patient diabetic?: Yes Is patient checking blood sugars at home?: Yes List most recent blood sugar readings, include date and time of day: Reports BS ranging from 125-145 on average. BS reported today is 135. No symptoms and patient remains in compliance with his medications. Endocrine Self-Management Outcome: 4 (good)  Gastrointestinal Gastrointestinal Symptoms Reported: No symptoms reported Additional Gastrointestinal Details: Reports normal BM's- last reported today. Gastrointestinal Management Strategies: Diet modification Gastrointestinal Self-Management Outcome: 4 (good)    Genitourinary Genitourinary Symptoms Reported: No symptoms reported Genitourinary Management Strategies: Coping strategies Genitourinary Self-Management Outcome: 4 (good)  Integumentary Integumentary Symptoms Reported: No symptoms reported Skin Management Strategies: Routine screening Skin Self-Management Outcome: 4 (good)  Musculoskeletal Musculoskelatal Symptoms Reviewed: No symptoms reported Musculoskeletal Management Strategies: Routine screening Musculoskeletal Self-Management Outcome: 4 (good) Falls in the past year?: No Number of falls in past year: 1 or less Was there an injury with Fall?: No Fall Risk Category Calculator: 0 Patient Fall Risk Level: Low Fall Risk    Psychosocial Psychosocial Symptoms Reported: No symptoms reported Behavioral Management Strategies: Support system, Coping strategies, Adequate rest Behavioral Health Self-Management Outcome: 4 (good) Major Change/Loss/Stressor/Fears (CP): Denies Techniques to Cope with Loss/Stress/Change: Diversional activities, Support group Quality of Family Relationships: helpful, involved, supportive Do you feel physically  threatened by others?: No    12/21/2024    PHQ2-9 Depression Screening   Little interest or pleasure in doing things Not at all  Feeling down, depressed, or hopeless Not at all  PHQ-2 - Total Score 0  Trouble falling or staying asleep, or sleeping too much    Feeling tired or having little energy    Poor appetite or overeating     Feeling bad about yourself - or  that you are a failure or have let yourself or your family down    Trouble concentrating on things, such as reading the newspaper or watching television    Moving or speaking so slowly that other people could have noticed.  Or the opposite - being so fidgety or restless that you have been moving around a lot more than usual    Thoughts that you would be better off dead, or hurting yourself in some way    PHQ2-9 Total Score    If you checked off any problems, how difficult have these problems made it for you to do your work, take care of things at home, or get along with other people    Depression Interventions/Treatment      Today's Vitals   12/21/24 1409  BP: 132/86  Weight: 206 lb (93.4 kg)   Pain Scale: 0-10 Pain Score: 0-No pain  Medications Reviewed Today     Reviewed by Leodis Warren DEL, RN (Registered Nurse) on 12/21/24 at 1406  Med List Status: <None>   Medication Order Taking? Sig Documenting Provider Last Dose Status Informant  amiodarone  (PACERONE ) 200 MG tablet [id number] Yes Take 1 tablet (200 mg total) by mouth daily. Krasowski, Robert J, MD  Active Self  apixaban  (ELIQUIS ) 5 MG TABS tablet [id number] Yes Take 1 tablet (5 mg total) by mouth 2 (two) times daily. Newlin, Enobong, MD  Active Self  atorvastatin  (LIPITOR) 40 MG tablet [id number] Yes Take 1 tablet (40 mg total) by mouth daily. Krasowski, Robert J, MD  Active Self           Med Note KERRIN, MELISSA R   Mon Dec 17, 2024 11:51 AM) Not taking due to possible itching   metFORMIN  (GLUCOPHAGE -XR) 500 MG 24 hr tablet [id number] Yes Take 500 mg by mouth daily  with breakfast. [provider]  Active Self  metoprolol  succinate (TOPROL -XL) 50 MG 24 hr tablet [id number] Yes Take 1 tablet (50 mg total) by mouth 2 (two) times daily. Take with or immediately following a meal. Krasowski, Robert J, MD  Active Self  Med List Note Beverlee Reyes ORN, CPhT 07/12/14 0204): Doesn't have a pharmacy he uses regularly             Recommendation:   Continue Current Plan of Care  Follow Up Plan:   Telephone follow up appointment date/time:  01/17/25 at 10 am  Warren Leodis RN CM Population Health-Complex Care Management [facility] [phone]

## 2025-01-17 ENCOUNTER — Telehealth

## 2025-01-28 ENCOUNTER — Ambulatory Visit: Payer: Self-pay | Admitting: Cardiology
